# Patient Record
Sex: Female | Born: 1995 | Hispanic: Yes | Marital: Single | Smoking: Former smoker
Health system: Southern US, Community
[De-identification: ages and names within clinical notes are randomized; demographics above are authoritative.]

## PROBLEM LIST (undated history)

## (undated) DIAGNOSIS — Z789 Other specified health status: Secondary | ICD-10-CM

## (undated) DIAGNOSIS — R519 Headache, unspecified: Secondary | ICD-10-CM

## (undated) DIAGNOSIS — R51 Headache: Secondary | ICD-10-CM

## (undated) HISTORY — PX: NO PAST SURGERIES: SHX2092

---

## 2015-02-04 ENCOUNTER — Inpatient Hospital Stay (HOSPITAL_COMMUNITY)
Admission: AD | Admit: 2015-02-04 | Discharge: 2015-02-04 | Disposition: A | Discharge status: Home / Self Care | Payer: Self-pay | Source: Ambulatory Visit | Attending: Obstetrics & Gynecology | Admitting: Obstetrics & Gynecology

## 2015-02-04 ENCOUNTER — Inpatient Hospital Stay (HOSPITAL_COMMUNITY): Payer: Self-pay

## 2015-02-04 ENCOUNTER — Encounter (HOSPITAL_COMMUNITY): Payer: Self-pay | Admitting: Obstetrics and Gynecology

## 2015-02-04 DIAGNOSIS — O3680X0 Pregnancy with inconclusive fetal viability, not applicable or unspecified: Secondary | ICD-10-CM

## 2015-02-04 DIAGNOSIS — Z3A00 Weeks of gestation of pregnancy not specified: Secondary | ICD-10-CM | POA: Insufficient documentation

## 2015-02-04 DIAGNOSIS — O209 Hemorrhage in early pregnancy, unspecified: Secondary | ICD-10-CM

## 2015-02-04 DIAGNOSIS — O200 Threatened abortion: Secondary | ICD-10-CM | POA: Insufficient documentation

## 2015-02-04 LAB — CBC
HCT: 40.8 % (ref 36.0–46.0)
Hemoglobin: 14.1 g/dL (ref 12.0–15.0)
MCH: 32.5 pg (ref 26.0–34.0)
MCHC: 34.6 g/dL (ref 30.0–36.0)
MCV: 94 fL (ref 78.0–100.0)
PLATELETS: 272 10*3/uL (ref 150–400)
RBC: 4.34 MIL/uL (ref 3.87–5.11)
RDW: 12.8 % (ref 11.5–15.5)
WBC: 8.5 10*3/uL (ref 4.0–10.5)

## 2015-02-04 LAB — URINALYSIS, ROUTINE W REFLEX MICROSCOPIC
Bilirubin Urine: NEGATIVE
Glucose, UA: NEGATIVE mg/dL
Ketones, ur: NEGATIVE mg/dL
LEUKOCYTES UA: NEGATIVE
Nitrite: NEGATIVE
Protein, ur: NEGATIVE mg/dL
SPECIFIC GRAVITY, URINE: 1.025 (ref 1.005–1.030)
UROBILINOGEN UA: 0.2 mg/dL (ref 0.0–1.0)
pH: 6 (ref 5.0–8.0)

## 2015-02-04 LAB — URINE MICROSCOPIC-ADD ON

## 2015-02-04 LAB — ABO/RH: ABO/RH(D): O POS

## 2015-02-04 LAB — HCG, QUANTITATIVE, PREGNANCY: HCG, BETA CHAIN, QUANT, S: 72 m[IU]/mL — AB (ref <5)

## 2015-02-04 LAB — POCT PREGNANCY, URINE: Preg Test, Ur: POSITIVE — AB

## 2015-02-04 NOTE — MAU Provider Note (Signed)
History     CSN: [id number]  Arrival date and time: 02/04/15 1132   None     Chief Complaint  Patient presents with  . Vaginal Bleeding   HPI   Ms Mary Herrera is a 19 y.o. female G2P0010 at Unknown.  Presenting to MAU with vaginal bleeding. She started having menstrual like bleeding yesterday; no recent intercourse. In the last 24 hours she has soaked 10 pads.  OB History    Gravida Para Term Preterm AB TAB SAB Ectopic Multiple Living   2 0   1  1   0      Past Medical History  Diagnosis Date  . Heart murmur     Past Surgical History  Procedure Laterality Date  . No past surgeries      History reviewed. No pertinent family history.  History  Substance Use Topics  . Smoking status: Not on file  . Smokeless tobacco: Not on file  . Alcohol Use: Not on file    Allergies: Allergies not on file  No prescriptions prior to admission    Results for orders placed or performed during the hospital encounter of 02/04/15 (from the past 48 hour(s))  Urinalysis, Routine w reflex microscopic (not at [facility])     Status: Abnormal   Collection Time: 02/04/15 11:48 AM  Result Value Ref Range   Color, Urine YELLOW YELLOW   APPearance CLEAR CLEAR   Specific Gravity, Urine 1.025 1.005 - 1.030   pH 6.0 5.0 - 8.0   Glucose, UA NEGATIVE NEGATIVE mg/dL   Hgb urine dipstick LARGE (A) NEGATIVE   Bilirubin Urine NEGATIVE NEGATIVE   Ketones, ur NEGATIVE NEGATIVE mg/dL   Protein, ur NEGATIVE NEGATIVE mg/dL   Urobilinogen, UA 0.2 0.0 - 1.0 mg/dL   Nitrite NEGATIVE NEGATIVE   Leukocytes, UA NEGATIVE NEGATIVE  Urine microscopic-add on     Status: Abnormal   Collection Time: 02/04/15 11:48 AM  Result Value Ref Range   Squamous Epithelial / LPF FEW (A) RARE   WBC, UA 0-2 <3 WBC/hpf   RBC / HPF 0-2 <3 RBC/hpf   Urine-Other MUCOUS PRESENT   Pregnancy, urine POC     Status: Abnormal   Collection Time: 02/04/15 12:50 PM  Result Value Ref Range   Preg Test, Ur POSITIVE (A)  NEGATIVE    Comment:        THE SENSITIVITY OF THIS METHODOLOGY IS >24 mIU/mL   CBC     Status: None   Collection Time: 02/04/15  2:52 PM  Result Value Ref Range   WBC 8.5 4.0 - 10.5 K/uL   RBC 4.34 3.87 - 5.11 MIL/uL   Hemoglobin 14.1 12.0 - 15.0 g/dL   HCT 40.9 81.1 - 91.4 %   MCV 94.0 78.0 - 100.0 fL   MCH 32.5 26.0 - 34.0 pg   MCHC 34.6 30.0 - 36.0 g/dL   RDW 78.2 95.6 - 21.3 %   Platelets 272 150 - 400 K/uL  ABO/Rh     Status: None   Collection Time: 02/04/15  2:52 PM  Result Value Ref Range   ABO/RH(D) O POS   hCG, quantitative, pregnancy     Status: Abnormal   Collection Time: 02/04/15  2:52 PM  Result Value Ref Range   hCG, Beta Chain, Quant, S 72 (H) <5 mIU/mL    Comment:          GEST. AGE      CONC.  (mIU/mL)   <=1 WEEK  5 - 50     2 WEEKS       50 - 500     3 WEEKS       100 - 10,000     4 WEEKS     1,000 - 30,000     5 WEEKS     3,500 - 115,000   6-8 WEEKS     12,000 - 270,000    12 WEEKS     15,000 - 220,000        FEMALE AND NON-PREGNANT FEMALE:     LESS THAN 5 mIU/mL    [place] Ob Comp Less 14 Wks  02/04/2015   CLINICAL DATA:  Heavy vaginal bleeding  EXAM: OBSTETRIC <14 WK US AND TRANSVAGINAL OB US  TECHNIQUE: Both transabdominal and transvaginal ultrasound examinations were performed for complete evaluation of the gestation as well as the maternal uterus, adnexal regions, and pelvic cul-de-sac. Transvaginal technique was performed to assess early pregnancy.  COMPARISON:  None.  FINDINGS: Intrauterine gestational sac: None  Yolk sac:  None  Embryo:  None  Cardiac Activity: Not applicable  Maternal uterus/adnexae:  Subchorionic hemorrhage: Not applicable  Right ovary: Normal  Left ovary: Normal  Other :None  Free fluid:  Trace free fluid within the pelvis.  IMPRESSION: 1. No intrauterine gestational sac, yolk sac, or fetal pole identified. Differential considerations include intrauterine pregnancy too early to be sonographically visualized, missed abortion, or  ectopic pregnancy. Followup ultrasound is recommended in 10-14 days for further evaluation.   Electronically Signed   By: Signa Kellaylor  Stroud M.D.   On: 02/04/2015 15:43   [place] Ob Transvaginal  02/04/2015   CLINICAL DATA:  Heavy vaginal bleeding  EXAM: OBSTETRIC <14 WK US AND TRANSVAGINAL OB US  TECHNIQUE: Both transabdominal and transvaginal ultrasound examinations were performed for complete evaluation of the gestation as well as the maternal uterus, adnexal regions, and pelvic cul-de-sac. Transvaginal technique was performed to assess early pregnancy.  COMPARISON:  None.  FINDINGS: Intrauterine gestational sac: None  Yolk sac:  None  Embryo:  None  Cardiac Activity: Not applicable  Maternal uterus/adnexae:  Subchorionic hemorrhage: Not applicable  Right ovary: Normal  Left ovary: Normal  Other :None  Free fluid:  Trace free fluid within the pelvis.  IMPRESSION: 1. No intrauterine gestational sac, yolk sac, or fetal pole identified. Differential considerations include intrauterine pregnancy too early to be sonographically visualized, missed abortion, or ectopic pregnancy. Followup ultrasound is recommended in 10-14 days for further evaluation.   Electronically Signed   By: Signa Kellaylor  Stroud M.D.   On: 02/04/2015 15:43    Review of Systems  Constitutional: Negative for fever and chills.  Gastrointestinal: Positive for abdominal pain. Negative for nausea and vomiting.  Neurological: Positive for dizziness.   Physical Exam   Blood pressure 132/71, pulse 74, temperature 98 F (36.7 C), temperature source Oral, resp. rate 18, height 5\' 3"  (1.6 m), weight 76.204 kg (168 lb), last menstrual period 12/06/2014.  Physical Exam  Constitutional: She is oriented to person, place, and time. She appears well-developed and well-nourished. No distress.  HENT:  Head: Normocephalic.  Eyes: Pupils are equal, round, and reactive to light.  Neck: Neck supple.  Respiratory: Effort normal.  Genitourinary:  Dilation: Closed,  anterior  Effacement (%): Thick Exam by:: J. Kaegan Hettich NP Small amount of dark red blood on exam glove. Minimal blood noted on peri pad.   Musculoskeletal: Normal range of motion.  Neurological: She is alert and oriented to person, place, and time.  Skin: Skin is warm. She is not diaphoretic.  Psychiatric: Her behavior is normal.    MAU Course  Procedures  None  MDM  O positive blood type  Wet prep  GC  Assessment and Plan    A:  1. Pregnancy of unknown anatomic location   2. Vaginal bleeding in pregnancy, first trimester   3. Threatened miscarriage     P:  Discharge home in stable condition  Bleeding precautions Return to MAU in 48 hours for repeat beta hcg level  Return to MAU if symptoms worsen  Pelvic rest      Duane Lope, NP 02/04/2015 4:29 PM

## 2015-02-04 NOTE — MAU Note (Signed)
Pt presents complaining of vaginal bleeding worse than a period this morning with clots. Also has abdominal pain. 3 pads in last hour.

## 2015-02-04 NOTE — MAU Note (Signed)
Pt reports having started bleeding upon awaking this morning.  Says has changed pad#x in past 5-6 hours.  Bleeding now is dark red/brown.  Slight, lower abd cramping all day, lessening as the day went on.  Denies any dysuria.

## 2015-02-05 LAB — HIV ANTIBODY (ROUTINE TESTING W REFLEX): HIV Screen 4th Generation wRfx: NONREACTIVE

## 2015-02-06 ENCOUNTER — Inpatient Hospital Stay (HOSPITAL_COMMUNITY)
Admission: AD | Admit: 2015-02-06 | Discharge: 2015-02-06 | Disposition: A | Discharge status: Home / Self Care | Payer: Self-pay | Source: Ambulatory Visit | Attending: Obstetrics & Gynecology | Admitting: Obstetrics & Gynecology

## 2015-02-06 DIAGNOSIS — O039 Complete or unspecified spontaneous abortion without complication: Secondary | ICD-10-CM | POA: Insufficient documentation

## 2015-02-06 LAB — HCG, QUANTITATIVE, PREGNANCY: hCG, Beta Chain, Quant, S: 40 m[IU]/mL — ABNORMAL HIGH (ref <5)

## 2015-02-06 NOTE — MAU Note (Signed)
Pt presents for repeat HCG. Denies increase in pain or bleeding.

## 2015-02-06 NOTE — MAU Provider Note (Signed)
Ms. Mary Herrera  is a 19 y.o. G2P0010 at Unknown who presents to MAU today for follow-up quant hCG after 48 hours. The patient was seen on 02/04/15 with moderate vaginal bleeding. She states a small amount of continued bleeding. She denies significant pain.   BP 122/89 mmHg  Pulse 110  Temp(Src) 98 F (36.7 C) (Oral)  Resp 18  LMP 12/06/2014 (Approximate)  CONSTITUTIONAL: Well-developed, well-nourished female in no acute distress.  ENT: External right and left ear normal.  EYES: EOM intact, conjunctivae normal.  MUSCULOSKELETAL: Normal range of motion.  CARDIOVASCULAR: Regular heart rate RESPIRATORY: Normal effort NEUROLOGICAL: Alert and oriented to person, place, and time.  SKIN: Skin is warm and dry. No rash noted. Not diaphoretic. No erythema. No pallor. PSYCH: Normal mood and affect. Normal behavior. Normal judgment and thought content.  Results for Mary Herrera, Mary Herrera (MRN [id number]) as of 02/06/2015 15:21  Ref. Range 02/04/2015 14:52 02/04/2015 15:28 02/06/2015 14:30  HCG, Beta Chain, Quant, S Latest Ref Range: <5 mIU/mL 72 (H)  40 (H)   MDM Discussed with Dr. Erin FullingHarraway-Smith. She agrees that this is likely SAB and patient can follow-up in the [facility] in 1-2 weeks  A: SAB  P: Discharge home Bleeding precautions discussed [facility]. They will call her with an appointment time.  Patient may return to MAU as needed or if her condition were to change or worsen   Marny LowensteinJulie N Wenzel, PA-C 02/06/2015 3:53 PM

## 2015-02-06 NOTE — Discharge Instructions (Signed)
Incomplete Miscarriage A miscarriage is the sudden loss of an unborn baby (fetus) before the 20th week of pregnancy. In an incomplete miscarriage, parts of the fetus or placenta (afterbirth) remain in the body.  Having a miscarriage can be an emotional experience. Talk with your health care provider about any questions you may have about miscarrying, the grieving process, and your future pregnancy plans. CAUSES   Problems with the fetal chromosomes that make it impossible for the baby to develop normally. Problems with the baby's genes or chromosomes are most often the result of errors that occur by chance as the embryo divides and grows. The problems are not inherited from the parents.  Infection of the cervix or uterus.  Hormone problems.  Problems with the cervix, such as having an incompetent cervix. This is when the tissue in the cervix is not strong enough to hold the pregnancy.  Problems with the uterus, such as an abnormally shaped uterus, uterine fibroids, or congenital abnormalities.  Certain medical conditions.  Smoking, drinking alcohol, or taking illegal drugs.  Trauma. SYMPTOMS   Vaginal bleeding or spotting, with or without cramps or pain.  Pain or cramping in the abdomen or lower back.  Passing fluid, tissue, or blood clots from the vagina. DIAGNOSIS  Your health care provider will perform a physical exam. You may also have an ultrasound to confirm the miscarriage. Blood or urine tests may also be ordered. TREATMENT   Usually, a dilation and curettage (D&C) procedure is performed. During a D&C procedure, the cervix is widened (dilated) and any remaining fetal or placental tissue is gently removed from the uterus.  Antibiotic medicines are prescribed if there is an infection. Other medicines may be given to reduce the size of the uterus (contract) if there is a lot of bleeding.  If you have Rh negative blood and your baby was Rh positive, you will need a Rho (D)  immune globulin shot. This shot will protect any future baby from having Rh blood problems in future pregnancies.  You may be confined to bed rest. This means you should stay in bed and only get up to use the bathroom. HOME CARE INSTRUCTIONS   Rest as directed by your health care provider.  Restrict activity as directed by your health care provider. You may be allowed to continue light activity if curettage was not done but you require further treatment.  Keep track of the number of pads you use each day. Keep track of how soaked (saturated) they are. Record this information.  Do not  use tampons.  Do not douche or have sexual intercourse until approved by your health care provider.  Keep all follow-up appointments for reevaluation and continuing management.  Only take over-the-counter or prescription medicines for pain, fever, or discomfort as directed by your health care provider.  Take antibiotic medicine as directed by your health care provider. Make sure you finish it even if you start to feel better. SEEK IMMEDIATE MEDICAL CARE IF:   You experience severe cramps in your stomach, back, or abdomen.  You have an unexplained temperature (make sure to record these temperatures).  You pass large clots or tissue (save these for your health care provider to inspect).  Your bleeding increases.  You become light-headed, weak, or have fainting episodes. MAKE SURE YOU:   Understand these instructions.  Will watch your condition.  Will get help right away if you are not doing well or get worse. Document Released: 07/10/2005 Document Revised: 11/24/2013 Document Reviewed:   02/06/2013 Exit[employer] Patient Information 2015 [employer]. This information is not intended to replace advice given to you by your health care provider. Make sure you discuss any questions you have with your health care provider.  

## 2015-02-22 ENCOUNTER — Encounter: Payer: Self-pay | Admitting: Obstetrics and Gynecology

## 2015-07-24 ENCOUNTER — Emergency Department (HOSPITAL_COMMUNITY): Payer: No Typology Code available for payment source

## 2015-07-24 ENCOUNTER — Encounter (HOSPITAL_COMMUNITY): Payer: Self-pay | Admitting: Oncology

## 2015-07-24 ENCOUNTER — Emergency Department (HOSPITAL_COMMUNITY)
Admission: EM | Admit: 2015-07-24 | Discharge: 2015-07-24 | Disposition: A | Discharge status: Home / Self Care | Payer: No Typology Code available for payment source | Attending: Emergency Medicine | Admitting: Emergency Medicine

## 2015-07-24 DIAGNOSIS — Y92410 Unspecified street and highway as the place of occurrence of the external cause: Secondary | ICD-10-CM | POA: Insufficient documentation

## 2015-07-24 DIAGNOSIS — S0181XA Laceration without foreign body of other part of head, initial encounter: Secondary | ICD-10-CM | POA: Diagnosis not present

## 2015-07-24 DIAGNOSIS — R011 Cardiac murmur, unspecified: Secondary | ICD-10-CM | POA: Insufficient documentation

## 2015-07-24 DIAGNOSIS — Z3202 Encounter for pregnancy test, result negative: Secondary | ICD-10-CM | POA: Insufficient documentation

## 2015-07-24 DIAGNOSIS — R110 Nausea: Secondary | ICD-10-CM | POA: Insufficient documentation

## 2015-07-24 DIAGNOSIS — S01511A Laceration without foreign body of lip, initial encounter: Secondary | ICD-10-CM | POA: Diagnosis not present

## 2015-07-24 DIAGNOSIS — Z880 Allergy status to penicillin: Secondary | ICD-10-CM | POA: Insufficient documentation

## 2015-07-24 DIAGNOSIS — Y9389 Activity, other specified: Secondary | ICD-10-CM | POA: Insufficient documentation

## 2015-07-24 DIAGNOSIS — Y998 Other external cause status: Secondary | ICD-10-CM | POA: Insufficient documentation

## 2015-07-24 DIAGNOSIS — S92302A Fracture of unspecified metatarsal bone(s), left foot, initial encounter for closed fracture: Secondary | ICD-10-CM

## 2015-07-24 DIAGNOSIS — S3991XA Unspecified injury of abdomen, initial encounter: Secondary | ICD-10-CM | POA: Diagnosis not present

## 2015-07-24 DIAGNOSIS — S99912A Unspecified injury of left ankle, initial encounter: Secondary | ICD-10-CM | POA: Diagnosis present

## 2015-07-24 DIAGNOSIS — S92352A Displaced fracture of fifth metatarsal bone, left foot, initial encounter for closed fracture: Secondary | ICD-10-CM | POA: Diagnosis not present

## 2015-07-24 DIAGNOSIS — Z23 Encounter for immunization: Secondary | ICD-10-CM | POA: Insufficient documentation

## 2015-07-24 DIAGNOSIS — Z87891 Personal history of nicotine dependence: Secondary | ICD-10-CM | POA: Insufficient documentation

## 2015-07-24 LAB — I-STAT CHEM 8, ED
BUN: 20 mg/dL (ref 6–20)
CREATININE: 0.7 mg/dL (ref 0.44–1.00)
Calcium, Ion: 1.17 mmol/L (ref 1.12–1.23)
Chloride: 106 mmol/L (ref 101–111)
GLUCOSE: 102 mg/dL — AB (ref 65–99)
HCT: 46 % (ref 36.0–46.0)
HEMOGLOBIN: 15.6 g/dL — AB (ref 12.0–15.0)
Potassium: 3.7 mmol/L (ref 3.5–5.1)
Sodium: 142 mmol/L (ref 135–145)
TCO2: 25 mmol/L (ref 0–100)

## 2015-07-24 LAB — CBC WITH DIFFERENTIAL/PLATELET
BASOS ABS: 0 10*3/uL (ref 0.0–0.1)
BASOS PCT: 0 %
Eosinophils Absolute: 0.1 10*3/uL (ref 0.0–0.7)
Eosinophils Relative: 1 %
HEMATOCRIT: 42.8 % (ref 36.0–46.0)
HEMOGLOBIN: 14.7 g/dL (ref 12.0–15.0)
LYMPHS PCT: 16 %
Lymphs Abs: 1.9 10*3/uL (ref 0.7–4.0)
MCH: 32 pg (ref 26.0–34.0)
MCHC: 34.3 g/dL (ref 30.0–36.0)
MCV: 93 fL (ref 78.0–100.0)
Monocytes Absolute: 0.5 10*3/uL (ref 0.1–1.0)
Monocytes Relative: 4 %
NEUTROS ABS: 9.2 10*3/uL — AB (ref 1.7–7.7)
Neutrophils Relative %: 79 %
Platelets: 226 10*3/uL (ref 150–400)
RBC: 4.6 MIL/uL (ref 3.87–5.11)
RDW: 12.7 % (ref 11.5–15.5)
WBC: 11.6 10*3/uL — ABNORMAL HIGH (ref 4.0–10.5)

## 2015-07-24 LAB — ETHANOL: Alcohol, Ethyl (B): <5 mg/dL (ref <5)

## 2015-07-24 LAB — BASIC METABOLIC PANEL
ANION GAP: 10 (ref 5–15)
BUN: 19 mg/dL (ref 6–20)
CHLORIDE: 108 mmol/L (ref 101–111)
CO2: 24 mmol/L (ref 22–32)
Calcium: 9.5 mg/dL (ref 8.9–10.3)
Creatinine, Ser: 0.69 mg/dL (ref 0.44–1.00)
GFR calc Af Amer: >60 mL/min (ref >60)
GFR calc non Af Amer: >60 mL/min (ref >60)
Glucose, Bld: 103 mg/dL — ABNORMAL HIGH (ref 65–99)
Potassium: 3.9 mmol/L (ref 3.5–5.1)
Sodium: 142 mmol/L (ref 135–145)

## 2015-07-24 LAB — I-STAT BETA HCG BLOOD, ED (MC, WL, AP ONLY)

## 2015-07-24 MED ORDER — LIDOCAINE HCL (PF) 1 % IJ SOLN
30.0000 mL | Freq: Once | INTRAMUSCULAR | Status: AC
  Administered 2015-07-24: 30 mL
  Filled 2015-07-24: qty 30

## 2015-07-24 MED ORDER — BACITRACIN ZINC 500 UNIT/GM EX OINT
TOPICAL_OINTMENT | Freq: Two times a day (BID) | CUTANEOUS | Status: DC
  Administered 2015-07-24: 1 via TOPICAL
  Filled 2015-07-24: qty 1.8

## 2015-07-24 MED ORDER — IOHEXOL 300 MG/ML  SOLN
100.0000 mL | Freq: Once | INTRAMUSCULAR | Status: AC | PRN
  Administered 2015-07-24: 100 mL via INTRAVENOUS

## 2015-07-24 MED ORDER — LIDOCAINE-EPINEPHRINE (PF) 1 %-1:200000 IJ SOLN
INTRAMUSCULAR | Status: AC
  Administered 2015-07-24: 30 mL
  Filled 2015-07-24: qty 30

## 2015-07-24 MED ORDER — ONDANSETRON HCL 4 MG/2ML IJ SOLN
4.0000 mg | INTRAMUSCULAR | Status: AC
  Administered 2015-07-24: 4 mg via INTRAVENOUS
  Filled 2015-07-24: qty 2

## 2015-07-24 MED ORDER — TETANUS-DIPHTH-ACELL PERTUSSIS 5-2.5-18.5 LF-MCG/0.5 IM SUSP
0.5000 mL | Freq: Once | INTRAMUSCULAR | Status: AC
  Administered 2015-07-24: 0.5 mL via INTRAMUSCULAR
  Filled 2015-07-24: qty 0.5

## 2015-07-24 MED ORDER — MORPHINE SULFATE (PF) 4 MG/ML IV SOLN
4.0000 mg | Freq: Once | INTRAVENOUS | Status: AC
  Administered 2015-07-24: 4 mg via INTRAVENOUS
  Filled 2015-07-24: qty 1

## 2015-07-24 MED ORDER — BACITRACIN ZINC 500 UNIT/GM EX OINT: 1.0000 "application " | TOPICAL_OINTMENT | Freq: Two times a day (BID) | CUTANEOUS | Status: DC

## 2015-07-24 NOTE — ED Notes (Signed)
Bed: WA14 Expected date:  Expected time:  Means of arrival:  Comments: ems 
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
## 2015-07-24 NOTE — ED Notes (Signed)
Patient transported to CT 
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
## 2015-07-24 NOTE — Discharge Instructions (Signed)
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 [phone], Ext. [phone] Second and Fourth Thursday of each month, opens at 6:30 AM; Clinic ends at 9 AM.  Patients are  seen on a first-come first-served basis, and a limited number are seen during each clinic.  ° °[facility] ° [address], [place], [place] [phone]   Eligibility Requirements °You must have lived in [place], [place], or [place] counties for at least the last three months. °  You cannot be eligible for state or federal sponsored healthcare insurance, including Veterans Administration, Medicaid, or Medicare. °  You generally cannot be eligible for healthcare insurance through your employer.  °  How to apply: °Eligibility screenings are held every Tuesday and Wednesday afternoon from 1:00 pm until 4:00 pm. You do not need an appointment for the interview!  °[facility] [address], [place], [place] [phone]   °[facility]  336-342-8273   °[facility]  336-703-3100   °[facility]  336-570-6415   ° °Behavioral Health Resources in the Community: °Intensive Outpatient Programs °Organization         Address  Phone  Notes  °[facility] [address], [place], [place] 336-878-6098   °[facility] [address], [place], [place] 336-832-9800   °[facility] [address], [place], [place]  ° 336-882-2125   °[facility] [address],  °, [place] 1-800-853-5163 or 336-641-4981   °Substance Abuse Resources °Organization         Address  Phone  Notes  °[facility]  336-882-2125   °[facility]  336-784-9470   °[facility]  336-285-9073   °[facility]  336-845-3988   °Residential & Outpatient Substance Abuse Program  1-800-659-3381   °Psychological Services °Organization         Address  Phone    Notes  °[facility]  336- 832-9600   °[facility]  336- 378-7881   °[facility] [address], [place] 1-800-853-5163 or 336-641-4981   ° °Mobile Crisis  Teams °Organization         Address  Phone  Notes  °[facility], Mobile Crisis Care Unit  1-877-626-1772   °Assertive °[facility] ° [address] [place], [place] 336-834-9664   °Sharon DeEsch [address] °[place] 336-554-5454   ° °Self-Help/Support Groups °Organization         Address  Phone             Notes  °[facility] - variety of support groups  336- 373-1402 Call for more information  °Narcotics Anonymous (NA), [facility] [address], °[place]  2 meetings at this location  ° °Residential Treatment Programs °Organization         Address  Phone  Notes  °[facility] [address],    °[place]  1-866-801-8205   °[facility] ° [address], [place], [place] 704-293-8524   °[facility] [address], [place] 336-845-3988 Admissions: 8am-3pm M-F  °[facility] [address]    °[place], [place] 336-841-1104   °[facility] [address], [place], [place] 336-379-7146   °[facility] [address]  °[place], [place] 336-285-9073   °[facility] - Intensive Outpatient [address], [place], [place] 336-852-3033   °[facility] [address]  °[place], [place] 1-877-615-2722 or 336-784-9470   °[facility] ([facility] [address] [place], [place] 336-227-7417 Accepts Medicaid  °[facility] [address]  ° [place] 1-800-659-3381 Substance Abuse/Addiction Treatment  ° °[facility] °Organization         Address  Phone  Notes  °[facility]  (888) 581-9988   °Julie Brannon, PhD [address] [place], [place]   (336) 349-5553 or (336) 951-0000   °[facility]   [address] °[place], [place] (336) 349-4454   °[facility] [address], [place], [place] (336) 342-8316  Insurance/Medicaid/sponsorship through [facility]  °[facility] [address]                                    [place], [place] (336) [phone] Therapy/tele-psych/case  °[facility] [address]  ° [place], [place] (336) 349-2233    °Dr. Arfeen  (336) 349-4544   °[facility]  [employer] [facility] 1) [address], [place] °2) [address], [place] °3)  [address], [place] (336) 349-3220 °(336) 342-7768 ° °(336) 342-8140   °[facility] (336) 342-1394 or (336) 342-3537 (After Hours)    ° ° ° °
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
## 2015-07-24 NOTE — ED Notes (Signed)
Plastic surgeon at bedside suturing patient's lip.

## 2015-07-24 NOTE — ED Notes (Signed)
Patient assisted to bathroom in wheelchair 

## 2015-07-25 NOTE — L&D Delivery Note (Signed)
20 y/o F now G2P1 who presented with PPROM and PTL. Was admitted to [facility] and contractions discontinued. They began again this AM. [facility].  Operative Delivery Note At  a viable female was delivered via .  Presentation: OP; Position: Posterior  APGAR: ,pending ; weight   pending Placenta status: intact delivered with gentle traction, sent to pathology Cord:  with the following complications none  Anesthesia:  Local lidocaine for repair Episiotomy:   Lacerations:   1st degree posterior Suture Repair: 3.0 vicryl rapide Est. Blood Loss (mL):  100  Mom to postpartum.  Baby to NICU.  Ernestina Pennaicholas Rola Lennon 03/24/2016, 3:35 PM

## 2016-03-23 ENCOUNTER — Encounter (HOSPITAL_COMMUNITY): Payer: Self-pay | Admitting: *Deleted

## 2016-03-23 ENCOUNTER — Inpatient Hospital Stay (HOSPITAL_COMMUNITY): Payer: Medicaid Other

## 2016-03-23 ENCOUNTER — Inpatient Hospital Stay (HOSPITAL_COMMUNITY)
Admission: AD | Admit: 2016-03-23 | Discharge: 2016-03-26 | DRG: 774 | Disposition: A | Discharge status: Home / Self Care | Payer: Medicaid Other | Source: Ambulatory Visit | Attending: Obstetrics & Gynecology | Admitting: Obstetrics & Gynecology

## 2016-03-23 DIAGNOSIS — F1210 Cannabis abuse, uncomplicated: Secondary | ICD-10-CM | POA: Diagnosis present

## 2016-03-23 DIAGNOSIS — Z880 Allergy status to penicillin: Secondary | ICD-10-CM

## 2016-03-23 DIAGNOSIS — F17210 Nicotine dependence, cigarettes, uncomplicated: Secondary | ICD-10-CM | POA: Diagnosis present

## 2016-03-23 DIAGNOSIS — O99334 Smoking (tobacco) complicating childbirth: Secondary | ICD-10-CM | POA: Diagnosis present

## 2016-03-23 DIAGNOSIS — Z3A30 30 weeks gestation of pregnancy: Secondary | ICD-10-CM | POA: Diagnosis not present

## 2016-03-23 DIAGNOSIS — O42913 Preterm premature rupture of membranes, unspecified as to length of time between rupture and onset of labor, third trimester: Secondary | ICD-10-CM | POA: Diagnosis present

## 2016-03-23 DIAGNOSIS — O42919 Preterm premature rupture of membranes, unspecified as to length of time between rupture and onset of labor, unspecified trimester: Secondary | ICD-10-CM | POA: Diagnosis present

## 2016-03-23 DIAGNOSIS — O99323 Drug use complicating pregnancy, third trimester: Secondary | ICD-10-CM | POA: Diagnosis present

## 2016-03-23 DIAGNOSIS — F1410 Cocaine abuse, uncomplicated: Secondary | ICD-10-CM | POA: Diagnosis present

## 2016-03-23 DIAGNOSIS — Z3493 Encounter for supervision of normal pregnancy, unspecified, third trimester: Secondary | ICD-10-CM

## 2016-03-23 DIAGNOSIS — F1910 Other psychoactive substance abuse, uncomplicated: Secondary | ICD-10-CM | POA: Diagnosis present

## 2016-03-23 DIAGNOSIS — N760 Acute vaginitis: Secondary | ICD-10-CM

## 2016-03-23 DIAGNOSIS — O99324 Drug use complicating childbirth: Secondary | ICD-10-CM | POA: Diagnosis present

## 2016-03-23 DIAGNOSIS — B9689 Other specified bacterial agents as the cause of diseases classified elsewhere: Secondary | ICD-10-CM

## 2016-03-23 HISTORY — DX: Other specified health status: Z78.9

## 2016-03-23 HISTORY — DX: Headache: R51

## 2016-03-23 HISTORY — DX: Headache, unspecified: R51.9

## 2016-03-23 LAB — URINALYSIS, ROUTINE W REFLEX MICROSCOPIC
GLUCOSE, UA: 100 mg/dL — AB
Ketones, ur: >80 mg/dL — AB
Nitrite: NEGATIVE
PH: 6 (ref 5.0–8.0)
Protein, ur: 30 mg/dL — AB
SPECIFIC GRAVITY, URINE: 1.02 (ref 1.005–1.030)

## 2016-03-23 LAB — URINE MICROSCOPIC-ADD ON

## 2016-03-23 LAB — WET PREP, GENITAL
SPERM: NONE SEEN
Trich, Wet Prep: NONE SEEN
YEAST WET PREP: NONE SEEN

## 2016-03-23 LAB — TYPE AND SCREEN
ABO/RH(D): O POS
ANTIBODY SCREEN: NEGATIVE

## 2016-03-23 LAB — HEPATITIS B SURFACE ANTIGEN: HEP B S AG: NEGATIVE

## 2016-03-23 LAB — DIFFERENTIAL
BASOS ABS: 0 10*3/uL (ref 0.0–0.1)
BASOS PCT: 0 %
EOS ABS: 0 10*3/uL (ref 0.0–0.7)
Eosinophils Relative: 0 %
Lymphocytes Relative: 11 %
Lymphs Abs: 1.9 10*3/uL (ref 0.7–4.0)
MONO ABS: 0.7 10*3/uL (ref 0.1–1.0)
MONOS PCT: 4 %
Neutro Abs: 14.8 10*3/uL — ABNORMAL HIGH (ref 1.7–7.7)
Neutrophils Relative %: 85 %

## 2016-03-23 LAB — RAPID URINE DRUG SCREEN, HOSP PERFORMED
Amphetamines: NOT DETECTED
BARBITURATES: NOT DETECTED
Benzodiazepines: NOT DETECTED
Cocaine: POSITIVE — AB
Opiates: NOT DETECTED
Tetrahydrocannabinol: POSITIVE — AB

## 2016-03-23 LAB — CBC
HEMATOCRIT: 37.5 % (ref 36.0–46.0)
Hemoglobin: 13.8 g/dL (ref 12.0–15.0)
MCH: 32.7 pg (ref 26.0–34.0)
MCHC: 36.8 g/dL — AB (ref 30.0–36.0)
MCV: 88.9 fL (ref 78.0–100.0)
Platelets: 227 10*3/uL (ref 150–400)
RBC: 4.22 MIL/uL (ref 3.87–5.11)
RDW: 12.4 % (ref 11.5–15.5)
WBC: 17.4 10*3/uL — ABNORMAL HIGH (ref 4.0–10.5)

## 2016-03-23 MED ORDER — CALCIUM CARBONATE ANTACID 500 MG PO CHEW: 2.0000 | CHEWABLE_TABLET | ORAL | Status: DC | PRN

## 2016-03-23 MED ORDER — CLINDAMYCIN PHOSPHATE 900 MG/50ML IV SOLN
900.0000 mg | Freq: Three times a day (TID) | INTRAVENOUS | Status: DC
  Administered 2016-03-23 – 2016-03-24 (×2): 900 mg via INTRAVENOUS
  Filled 2016-03-23 (×4): qty 50

## 2016-03-23 MED ORDER — PRENATAL MULTIVITAMIN CH
1.0000 | ORAL_TABLET | Freq: Every day | ORAL | Status: DC
  Administered 2016-03-24: 1 via ORAL
  Filled 2016-03-23: qty 1

## 2016-03-23 MED ORDER — MAGNESIUM SULFATE BOLUS VIA INFUSION
4.0000 g | Freq: Once | INTRAVENOUS | Status: AC
  Administered 2016-03-23: 4 g via INTRAVENOUS
  Filled 2016-03-23: qty 500

## 2016-03-23 MED ORDER — DOCUSATE SODIUM 100 MG PO CAPS
100.0000 mg | ORAL_CAPSULE | Freq: Every day | ORAL | Status: DC
  Administered 2016-03-24: 100 mg via ORAL
  Filled 2016-03-23: qty 1

## 2016-03-23 MED ORDER — AZITHROMYCIN 500 MG PO TABS: 500.0000 mg | ORAL_TABLET | Freq: Every day | ORAL | Status: DC

## 2016-03-23 MED ORDER — ZOLPIDEM TARTRATE 5 MG PO TABS: 5.0000 mg | ORAL_TABLET | Freq: Every evening | ORAL | Status: DC | PRN

## 2016-03-23 MED ORDER — DEXTROSE 5 % IV SOLN
500.0000 mg | INTRAVENOUS | Status: DC
  Administered 2016-03-23: 500 mg via INTRAVENOUS
  Filled 2016-03-23 (×2): qty 500

## 2016-03-23 MED ORDER — ACETAMINOPHEN 325 MG PO TABS: 650.0000 mg | ORAL_TABLET | ORAL | Status: DC | PRN

## 2016-03-23 MED ORDER — MAGNESIUM SULFATE 50 % IJ SOLN
2.0000 g/h | INTRAVENOUS | Status: DC
  Administered 2016-03-23: 2 g/h via INTRAVENOUS
  Filled 2016-03-23: qty 80

## 2016-03-23 MED ORDER — SODIUM CHLORIDE 0.9 % IV SOLN: INTRAVENOUS | Status: DC

## 2016-03-23 MED ORDER — BETAMETHASONE SOD PHOS & ACET 6 (3-3) MG/ML IJ SUSP
12.0000 mg | INTRAMUSCULAR | Status: DC
  Administered 2016-03-23: 12 mg via INTRAMUSCULAR
  Filled 2016-03-23 (×2): qty 2

## 2016-03-23 MED ORDER — METRONIDAZOLE 500 MG PO TABS
500.0000 mg | ORAL_TABLET | Freq: Two times a day (BID) | ORAL | Status: DC
  Administered 2016-03-23 – 2016-03-24 (×2): 500 mg via ORAL
  Filled 2016-03-23 (×4): qty 1

## 2016-03-23 NOTE — MAU Provider Note (Signed)
Chief Complaint:  Back Pain; Abdominal Pain; and Vaginal Discharge   First Provider Initiated Contact with Patient 03/23/16 1659     HPI: Mary Herrera is a 20 y.o. G2P0010 at 45w2dwho presents to maternity admissions reporting bloody mucous and abdominal sharp pain since yesterday.  No prenatal care "I don't know why".  States pregnancy undesired, plans adoption, but has made no arrangements. Tearful, quiet.  Female at bedside, unsure relation.. She reports good fetal movement, denies LOF, vaginal bleeding, vaginal itching/burning, urinary symptoms, h/a, dizziness, n/v, diarrhea, constipation or fever/chills.  She denies headache, visual changes or RUQ abdominal pain.  Abdominal Pain  This is a new problem. The current episode started yesterday. The onset quality is gradual. The problem occurs intermittently. The problem has been unchanged. The pain is located in the LLQ, RLQ and epigastric region. The pain is moderate. The quality of the pain is cramping and sharp. The abdominal pain does not radiate. Pertinent negatives include no constipation, diarrhea, dysuria, fever, headaches, myalgias, nausea or vomiting. The pain is relieved by nothing. She has tried nothing for the symptoms.   RN Note: 20 yr old; G2P!; no prenatal care; admitted to using cocaine and pot; hx of SAB; c/o reddish, mucousy, vaginal discharge; c/o low abdominal cramping that she rates @ 8 and c/o lower achy lower back; my LNMP, pt is [redacted]w[redacted]d- but pt is not sure of dates;  Past Medical History: Past Medical History:  Diagnosis Date  . Headache     Past obstetric history: OB History  Gravida Para Term Preterm AB Living  2 0     1 0  SAB TAB Ectopic Multiple Live Births  1            # Outcome Date GA Lbr Len/2nd Weight Sex Delivery Anes PTL Lv  2 Current           1 SAB               Past Surgical History: Past Surgical History:  Procedure Laterality Date  . NO PAST SURGERIES      Family History: Family  History  Problem Relation Age of Onset  . Diabetes Paternal Grandmother   . Hypertension Paternal Grandmother   . Alcohol abuse Neg Hx   . Arthritis Neg Hx   . Asthma Neg Hx   . Birth defects Neg Hx   . Cancer Neg Hx   . COPD Neg Hx   . Depression Neg Hx   . Drug abuse Neg Hx   . Early death Neg Hx   . Hearing loss Neg Hx   . Heart disease Neg Hx   . Hyperlipidemia Neg Hx   . Kidney disease Neg Hx   . Learning disabilities Neg Hx   . Mental illness Neg Hx   . Mental retardation Neg Hx   . Miscarriages / Stillbirths Neg Hx   . Stroke Neg Hx   . Vision loss Neg Hx   . Varicose Veins Neg Hx     Social History: Social History  Substance Use Topics  . Smoking status: Current Every Day Smoker    Packs/day: 0.25    Types: Cigarettes    Last attempt to quit: 01/21/2015  . Smokeless tobacco: Current User  . Alcohol use Yes     Comment: prior to pregnancy  + cocaine + MJ  Allergies:  Allergies  Allergen Reactions  . Penicillins Rash    Has patient had a PCN reaction causing immediate rash,  facial/tongue/throat swelling, SOB or lightheadedness with hypotension:Yes Has patient had a PCN reaction causing severe rash involving mucus membranes or skin necrosis:Yes Has patient had a PCN reaction that required hospitalization:Yes Has patient had a PCN reaction occurring within the last 10 years:Yes If all of the above answers are "NO", then may proceed with Cephalosporin use.     Meds:  Prescriptions Prior to Admission  Medication Sig Dispense Refill Last Dose  . bacitracin ointment Apply 1 application topically 2 (two) times daily. 120 g 0   . ibuprofen (ADVIL,MOTRIN) 200 MG tablet Take 400 mg by mouth every 6 (six) hours as needed (for pain.).   unknown    I have reviewed patient's Past Medical Hx, Surgical Hx, Family Hx, Social Hx, medications and allergies.   ROS:  Review of Systems  Constitutional: Negative for fever.  Gastrointestinal: Positive for abdominal pain.  Negative for constipation, diarrhea, nausea and vomiting.  Genitourinary: Negative for dysuria.  Musculoskeletal: Negative for myalgias.  Neurological: Negative for headaches.   Other systems negative  Physical Exam  Patient Vitals for the past 24 hrs:  BP Temp Temp src Pulse Resp  03/23/16 1542 128/75 98 F (36.7 C) Oral 62 18   Constitutional: Well-developed, well-nourished female in no acute distress.  Cardiovascular: normal rate and rhythm Respiratory: normal effort, clear to auscultation bilaterally GI: Abd soft, non-tender, gravid appropriate for gestational age.   No rebound or guarding. MS: Extremities nontender, no edema, normal ROM Neurologic: Alert and oriented x 4.  GU: Neg CVAT.  PELVIC EXAM: Cervix open, completely effaced, about 5cm.                           + pooling orange/brown fluid                           + ferning                            No other vaginal lesions.  Fundal height 34 cm   FHT:  Baseline 140 , moderate variability, accelerations present, no decelerations Contractions:  Irregular    Labs:    Results for orders placed or performed during the hospital encounter of 03/23/16 (from the past 24 hour(s))  Urinalysis, Routine w reflex microscopic (not [facility])     Status: Abnormal   Collection Time: 03/23/16  3:47 PM  Result Value Ref Range   Color, Urine AMBER (A) YELLOW   APPearance CLEAR CLEAR   Specific Gravity, Urine 1.020 1.005 - 1.030   pH 6.0 5.0 - 8.0   Glucose, UA 100 (A) NEGATIVE mg/dL   Hgb urine dipstick LARGE (A) NEGATIVE   Bilirubin Urine MODERATE (A) NEGATIVE   Ketones, ur >80 (A) NEGATIVE mg/dL   Protein, ur 30 (A) NEGATIVE mg/dL   Nitrite NEGATIVE NEGATIVE   Leukocytes, UA LARGE (A) NEGATIVE  Urine microscopic-add on     Status: Abnormal   Collection Time: 03/23/16  3:47 PM  Result Value Ref Range   Squamous Epithelial / LPF 6-30 (A) NONE SEEN   WBC, UA 6-30 0 - 5 WBC/hpf   RBC / HPF 0-5 0 - 5 RBC/hpf   Bacteria,  UA FEW (A) NONE SEEN   Urine-Other MUCOUS PRESENT   Urine rapid drug screen (hosp performed)     Status: Abnormal   Collection Time: 03/23/16  3:47 PM  Result Value Ref Range   Opiates NONE DETECTED NONE DETECTED   Cocaine POSITIVE (A) NONE DETECTED   Benzodiazepines NONE DETECTED NONE DETECTED   Amphetamines NONE DETECTED NONE DETECTED   Tetrahydrocannabinol POSITIVE (A) NONE DETECTED   Barbiturates NONE DETECTED NONE DETECTED  Wet prep, genital     Status: Abnormal   Collection Time: 03/23/16  5:00 PM  Result Value Ref Range   Yeast Wet Prep HPF POC NONE SEEN NONE SEEN   Trich, Wet Prep NONE SEEN NONE SEEN   Clue Cells Wet Prep HPF POC PRESENT (A) NONE SEEN   WBC, Wet Prep HPF POC MANY (A) NONE SEEN   Sperm NONE SEEN   CBC     Status: Abnormal   Collection Time: 03/23/16  5:31 PM  Result Value Ref Range   WBC 17.4 (H) 4.0 - 10.5 K/uL   RBC 4.22 3.87 - 5.11 MIL/uL   Hemoglobin 13.8 12.0 - 15.0 g/dL   HCT 16.1 09.6 - 04.5 %   MCV 88.9 78.0 - 100.0 fL   MCH 32.7 26.0 - 34.0 pg   MCHC 36.8 (H) 30.0 - 36.0 g/dL   RDW 40.9 81.1 - 91.4 %   Platelets 227 150 - 400 K/uL  Differential     Status: Abnormal   Collection Time: 03/23/16  5:31 PM  Result Value Ref Range   Neutrophils Relative % 85 %   Neutro Abs 14.8 (H) 1.7 - 7.7 K/uL   Lymphocytes Relative 11 %   Lymphs Abs 1.9 0.7 - 4.0 K/uL   Monocytes Relative 4 %   Monocytes Absolute 0.7 0.1 - 1.0 K/uL   Eosinophils Relative 0 %   Eosinophils Absolute 0.0 0.0 - 0.7 K/uL   Basophils Relative 0 %   Basophils Absolute 0.0 0.0 - 0.1 K/uL  Type and screen     Status: None (Preliminary result)   Collection Time: 03/23/16  5:31 PM  Result Value Ref Range   ABO/RH(D) O POS    Antibody Screen PENDING    Sample Expiration 03/26/2016      Imaging:  No results found.  MAU Course/MDM: I have ordered labs and reviewed results.  NST reviewed Consult Dr Alysia Penna with presentation, exam findings and test results.  Will admit for  antibiotics, neuroprophylaxis, and observation for now. May need to augment later due to PPROM.    Assessment: SIngle IUP at [redacted]w[redacted]d  Fundal height 34cm Preterm premature rupture of membranes Preterm labor Bacterial vaginosis UDS positive for cocaine and MJ  Plan: Admit to Antenatal unit Routine orders PPROM antibiotics Betamethasone Flagyl for BV Social work consult Magnesium sulfate for neuroprophylaxis and to allow Betamethasone  Wynelle Bourgeois CNM, MSN Certified Nurse-Midwife 03/23/2016 4:59 PM

## 2016-03-23 NOTE — H&P (Signed)
Obstetrics  Expand All Collapse All   [] Hide copied text Chief Complaint:  Back Pain; Abdominal Pain; and Vaginal Discharge   First Provider Initiated Contact with Patient 03/23/16 1659     HPI: Mary OberBrenda Herrera is a 20 y.o. G2P0010 at 2254w2dwho presents to maternity admissions reporting bloody mucous and abdominal sharp pain since yesterday.  No prenatal care "I don't know why".  States pregnancy undesired, plans adoption, but has made no arrangements. Tearful, quiet.  Female at bedside, unsure relation.. She reports good fetal movement, denies LOF, vaginal bleeding, vaginal itching/burning, urinary symptoms, h/a, dizziness, n/v, diarrhea, constipation or fever/chills.  She denies headache, visual changes or RUQ abdominal pain.  Abdominal Pain  This is a new problem. The current episode started yesterday. The onset quality is gradual. The problem occurs intermittently. The problem has been unchanged. The pain is located in the LLQ, RLQ and epigastric region. The pain is moderate. The quality of the pain is cramping and sharp. The abdominal pain does not radiate. Pertinent negatives include no constipation, diarrhea, dysuria, fever, headaches, myalgias, nausea or vomiting. The pain is relieved by nothing. She has tried nothing for the symptoms.   RN Note: 20 yr old; G2P!; no prenatal care; admitted to using cocaine and pot; hx of SAB; c/o reddish, mucousy, vaginal discharge; c/o low abdominal cramping that she rates @ 8 and c/o lower achy lower back; my LNMP, pt is 5354w2d- but pt is not sure of dates;  Past Medical History:     Past Medical History:  Diagnosis Date  . Headache     Past obstetric history:                 OB History  Gravida Para Term Preterm AB Living  2 0     1 0  SAB TAB Ectopic Multiple Live Births  1            # Outcome Date GA Lbr Len/2nd Weight Sex Delivery Anes PTL Lv  2 Current           1 SAB               Past Surgical  History:      Past Surgical History:  Procedure Laterality Date  . NO PAST SURGERIES      Family History:      Family History  Problem Relation Age of Onset  . Diabetes Paternal Grandmother   . Hypertension Paternal Grandmother   . Alcohol abuse Neg Hx   . Arthritis Neg Hx   . Asthma Neg Hx   . Birth defects Neg Hx   . Cancer Neg Hx   . COPD Neg Hx   . Depression Neg Hx   . Drug abuse Neg Hx   . Early death Neg Hx   . Hearing loss Neg Hx   . Heart disease Neg Hx   . Hyperlipidemia Neg Hx   . Kidney disease Neg Hx   . Learning disabilities Neg Hx   . Mental illness Neg Hx   . Mental retardation Neg Hx   . Miscarriages / Stillbirths Neg Hx   . Stroke Neg Hx   . Vision loss Neg Hx   . Varicose Veins Neg Hx     Social History:        Social History  Substance Use Topics  . Smoking status: Current Every Day Smoker    Packs/day: 0.25    Types: Cigarettes    Last attempt to quit:  01/21/2015  . Smokeless tobacco: Current User  . Alcohol use Yes      Comment: prior to pregnancy  + cocaine + MJ  Allergies:       Allergies  Allergen Reactions  . Penicillins Rash    Has patient had a PCN reaction causing immediate rash, facial/tongue/throat swelling, SOB or lightheadedness with hypotension:Yes Has patient had a PCN reaction causing severe rash involving mucus membranes or skin necrosis:Yes Has patient had a PCN reaction that required hospitalization:Yes Has patient had a PCN reaction occurring within the last 10 years:Yes If all of the above answers are "NO", then may proceed with Cephalosporin use.    Meds:         Prescriptions Prior to Admission  Medication Sig Dispense Refill Last Dose  . bacitracin ointment Apply 1 application topically 2 (two) times daily. 120 g 0   . ibuprofen (ADVIL,MOTRIN) 200 MG tablet Take 400 mg by mouth every 6 (six) hours as needed (for pain.).   unknown    I have reviewed patient's  Past Medical Hx, Surgical Hx, Family Hx, Social Hx, medications and allergies.   ROS:  Review of Systems  Constitutional: Negative for fever.  Gastrointestinal: Positive for abdominal pain. Negative for constipation, diarrhea, nausea and vomiting.  Genitourinary: Negative for dysuria.  Musculoskeletal: Negative for myalgias.  Neurological: Negative for headaches.   Other systems negative  Physical Exam  Patient Vitals for the past 24 hrs:  BP Temp Temp src Pulse Resp  03/23/16 1542 128/75 98 F (36.7 C) Oral 62 18   Constitutional: Well-developed, well-nourished female in no acute distress.  Cardiovascular: normal rate and rhythm Respiratory: normal effort, clear to auscultation bilaterally GI: Abd soft, non-tender, gravid appropriate for gestational age.   No rebound or guarding. MS: Extremities nontender, no edema, normal ROM Neurologic: Alert and oriented x 4.  GU: Neg CVAT.  PELVIC EXAM: Cervix open, completely effaced, about 5cm.                           + pooling orange/brown fluid                           + ferning                            No other vaginal lesions.  Fundal height 34 cm   FHT:  Baseline 140 , moderate variability, accelerations present, no decelerations Contractions:  Irregular    Labs:    LabResultsLast24Hours       Results for orders placed or performed during the hospital encounter of 03/23/16 (from the past 24 hour(s))  Urinalysis, Routine w reflex microscopic (not at [facility])     Status: Abnormal   Collection Time: 03/23/16  3:47 PM  Result Value Ref Range   Color, Urine AMBER (A) YELLOW   APPearance CLEAR CLEAR   Specific Gravity, Urine 1.020 1.005 - 1.030   pH 6.0 5.0 - 8.0   Glucose, UA 100 (A) NEGATIVE mg/dL   Hgb urine dipstick LARGE (A) NEGATIVE   Bilirubin Urine MODERATE (A) NEGATIVE   Ketones, ur >80 (A) NEGATIVE mg/dL   Protein, ur 30 (A) NEGATIVE mg/dL   Nitrite NEGATIVE NEGATIVE   Leukocytes, UA  LARGE (A) NEGATIVE  Urine microscopic-add on     Status: Abnormal   Collection Time: 03/23/16  3:47 PM  Result  Value Ref Range   Squamous Epithelial / LPF 6-30 (A) NONE SEEN   WBC, UA 6-30 0 - 5 WBC/hpf   RBC / HPF 0-5 0 - 5 RBC/hpf   Bacteria, UA FEW (A) NONE SEEN   Urine-Other MUCOUS PRESENT   Urine rapid drug screen (hosp performed)     Status: Abnormal   Collection Time: 03/23/16  3:47 PM  Result Value Ref Range   Opiates NONE DETECTED NONE DETECTED   Cocaine POSITIVE (A) NONE DETECTED   Benzodiazepines NONE DETECTED NONE DETECTED   Amphetamines NONE DETECTED NONE DETECTED   Tetrahydrocannabinol POSITIVE (A) NONE DETECTED   Barbiturates NONE DETECTED NONE DETECTED  Wet prep, genital     Status: Abnormal   Collection Time: 03/23/16  5:00 PM  Result Value Ref Range   Yeast Wet Prep HPF POC NONE SEEN NONE SEEN   Trich, Wet Prep NONE SEEN NONE SEEN   Clue Cells Wet Prep HPF POC PRESENT (A) NONE SEEN   WBC, Wet Prep HPF POC MANY (A) NONE SEEN   Sperm NONE SEEN   CBC     Status: Abnormal   Collection Time: 03/23/16  5:31 PM  Result Value Ref Range   WBC 17.4 (H) 4.0 - 10.5 K/uL   RBC 4.22 3.87 - 5.11 MIL/uL   Hemoglobin 13.8 12.0 - 15.0 g/dL   HCT 16.1 09.6 - 04.5 %   MCV 88.9 78.0 - 100.0 fL   MCH 32.7 26.0 - 34.0 pg   MCHC 36.8 (H) 30.0 - 36.0 g/dL   RDW 40.9 81.1 - 91.4 %   Platelets 227 150 - 400 K/uL  Differential     Status: Abnormal   Collection Time: 03/23/16  5:31 PM  Result Value Ref Range   Neutrophils Relative % 85 %   Neutro Abs 14.8 (H) 1.7 - 7.7 K/uL   Lymphocytes Relative 11 %   Lymphs Abs 1.9 0.7 - 4.0 K/uL   Monocytes Relative 4 %   Monocytes Absolute 0.7 0.1 - 1.0 K/uL   Eosinophils Relative 0 %   Eosinophils Absolute 0.0 0.0 - 0.7 K/uL   Basophils Relative 0 %   Basophils Absolute 0.0 0.0 - 0.1 K/uL  Type and screen     Status: None (Preliminary result)   Collection Time: 03/23/16  5:31 PM  Result Value  Ref Range   ABO/RH(D) O POS    Antibody Screen PENDING    Sample Expiration 03/26/2016        Imaging:  ImagingResults  No results found.    MAU Course/MDM: I have ordered labs and reviewed results.  NST reviewed Consult Dr Alysia Penna with presentation, exam findings and test results.  Will admit for antibiotics, neuroprophylaxis, and observation for now. May need to augment later due to PPROM.    Assessment: SIngle IUP at [redacted]w[redacted]d  Fundal height 34cm Preterm premature rupture of membranes Preterm labor Bacterial vaginosis UDS positive for cocaine and MJ  Plan: Admit to Antenatal unit Routine orders PPROM antibiotics Betamethasone Flagyl for BV Social work consult Magnesium sulfate for neuroprophylaxis and to allow Betamethasone  Wynelle Bourgeois CNM, MSN Certified Nurse-Midwife 03/23/2016 4:59 PM

## 2016-03-23 NOTE — MAU Note (Addendum)
Pt C/O sharp lower back & lower abd pain since yesterday, also started bleeding yesterday.  Bleeding is orange mucus consistency.  [facility]

## 2016-03-23 NOTE — MAU Note (Addendum)
20 yr old; G2P!; no prenatal care; admitted to using cocaine and pot; hx of SAB; c/o reddish, mucousy, vaginal discharge; c/o low abdominal cramping that she rates @ 8 and c/o lower achy lower back;not sure of LNMP, pt is 8545w2d- but pt is not sure of dates;

## 2016-03-24 ENCOUNTER — Encounter (HOSPITAL_COMMUNITY): Payer: Self-pay | Admitting: Neonatology

## 2016-03-24 DIAGNOSIS — O99334 Smoking (tobacco) complicating childbirth: Secondary | ICD-10-CM

## 2016-03-24 DIAGNOSIS — O99324 Drug use complicating childbirth: Secondary | ICD-10-CM

## 2016-03-24 DIAGNOSIS — O42913 Preterm premature rupture of membranes, unspecified as to length of time between rupture and onset of labor, third trimester: Secondary | ICD-10-CM

## 2016-03-24 DIAGNOSIS — Z3A30 30 weeks gestation of pregnancy: Secondary | ICD-10-CM

## 2016-03-24 LAB — CBC WITH DIFFERENTIAL/PLATELET
BASOS ABS: 0 10*3/uL (ref 0.0–0.1)
BASOS PCT: 0 %
Eosinophils Absolute: 0 10*3/uL (ref 0.0–0.7)
Eosinophils Relative: 0 %
HEMATOCRIT: 37 % (ref 36.0–46.0)
HEMOGLOBIN: 13.3 g/dL (ref 12.0–15.0)
Lymphocytes Relative: 7 %
Lymphs Abs: 1.3 10*3/uL (ref 0.7–4.0)
MCH: 32.8 pg (ref 26.0–34.0)
MCHC: 35.9 g/dL (ref 30.0–36.0)
MCV: 91.1 fL (ref 78.0–100.0)
MONO ABS: 0.7 10*3/uL (ref 0.1–1.0)
Monocytes Relative: 4 %
NEUTROS ABS: 17.5 10*3/uL — AB (ref 1.7–7.7)
NEUTROS PCT: 90 %
Platelets: 227 10*3/uL (ref 150–400)
RBC: 4.06 MIL/uL (ref 3.87–5.11)
RDW: 12.2 % (ref 11.5–15.5)
WBC: 19.5 10*3/uL — ABNORMAL HIGH (ref 4.0–10.5)

## 2016-03-24 LAB — GC/CHLAMYDIA PROBE AMP (~~LOC~~) NOT AT ARMC
CHLAMYDIA, DNA PROBE: NEGATIVE
NEISSERIA GONORRHEA: NEGATIVE

## 2016-03-24 LAB — HIV ANTIBODY (ROUTINE TESTING W REFLEX): HIV Screen 4th Generation wRfx: NONREACTIVE

## 2016-03-24 LAB — RPR: RPR: NONREACTIVE

## 2016-03-24 LAB — RUBELLA SCREEN: Rubella: 3.63 index (ref >0.99)

## 2016-03-24 MED ORDER — OXYTOCIN BOLUS FROM INFUSION
500.0000 mL | Freq: Once | INTRAVENOUS | Status: AC
  Administered 2016-03-24: 500 mL via INTRAVENOUS

## 2016-03-24 MED ORDER — DIBUCAINE 1 % RE OINT: 1.0000 "application " | TOPICAL_OINTMENT | RECTAL | Status: DC | PRN

## 2016-03-24 MED ORDER — OXYTOCIN 10 UNIT/ML IJ SOLN
INTRAMUSCULAR | Status: AC
  Filled 2016-03-24: qty 1

## 2016-03-24 MED ORDER — ACETAMINOPHEN 325 MG PO TABS: 650.0000 mg | ORAL_TABLET | ORAL | Status: DC | PRN

## 2016-03-24 MED ORDER — OXYCODONE-ACETAMINOPHEN 5-325 MG PO TABS: 1.0000 | ORAL_TABLET | ORAL | Status: DC | PRN

## 2016-03-24 MED ORDER — EPHEDRINE 5 MG/ML INJ
10.0000 mg | INTRAVENOUS | Status: DC | PRN
  Filled 2016-03-24: qty 4

## 2016-03-24 MED ORDER — SIMETHICONE 80 MG PO CHEW: 80.0000 mg | CHEWABLE_TABLET | ORAL | Status: DC | PRN

## 2016-03-24 MED ORDER — LIDOCAINE HCL (PF) 1 % IJ SOLN
30.0000 mL | INTRAMUSCULAR | Status: AC | PRN
  Administered 2016-03-24: 30 mL via SUBCUTANEOUS
  Filled 2016-03-24: qty 30

## 2016-03-24 MED ORDER — LACTATED RINGERS IV SOLN: 500.0000 mL | INTRAVENOUS | Status: DC | PRN

## 2016-03-24 MED ORDER — BENZOCAINE-MENTHOL 20-0.5 % EX AERO
1.0000 "application " | INHALATION_SPRAY | CUTANEOUS | Status: DC | PRN
  Administered 2016-03-24: 1 via TOPICAL
  Filled 2016-03-24: qty 56

## 2016-03-24 MED ORDER — ZOLPIDEM TARTRATE 5 MG PO TABS: 5.0000 mg | ORAL_TABLET | Freq: Every evening | ORAL | Status: DC | PRN

## 2016-03-24 MED ORDER — OXYTOCIN 40 UNITS IN LACTATED RINGERS INFUSION - SIMPLE MED: 2.5000 [IU]/h | INTRAVENOUS | Status: DC

## 2016-03-24 MED ORDER — ONDANSETRON HCL 4 MG/2ML IJ SOLN: 4.0000 mg | Freq: Four times a day (QID) | INTRAMUSCULAR | Status: DC | PRN

## 2016-03-24 MED ORDER — ONDANSETRON HCL 4 MG/2ML IJ SOLN: 4.0000 mg | INTRAMUSCULAR | Status: DC | PRN

## 2016-03-24 MED ORDER — PHENYLEPHRINE 40 MCG/ML (10ML) SYRINGE FOR IV PUSH (FOR BLOOD PRESSURE SUPPORT)
80.0000 ug | PREFILLED_SYRINGE | INTRAVENOUS | Status: DC | PRN
  Filled 2016-03-24: qty 10
  Filled 2016-03-24: qty 5

## 2016-03-24 MED ORDER — FENTANYL 2.5 MCG/ML BUPIVACAINE 1/10 % EPIDURAL INFUSION (WH - ANES)
14.0000 mL/h | INTRAMUSCULAR | Status: DC | PRN
  Filled 2016-03-24: qty 125

## 2016-03-24 MED ORDER — OXYTOCIN 40 UNITS IN LACTATED RINGERS INFUSION - SIMPLE MED
INTRAVENOUS | Status: AC
  Filled 2016-03-24: qty 1000

## 2016-03-24 MED ORDER — OXYCODONE-ACETAMINOPHEN 5-325 MG PO TABS
2.0000 | ORAL_TABLET | ORAL | Status: DC | PRN
  Administered 2016-03-24: 2 via ORAL
  Filled 2016-03-24: qty 2

## 2016-03-24 MED ORDER — ONDANSETRON HCL 4 MG PO TABS: 4.0000 mg | ORAL_TABLET | ORAL | Status: DC | PRN

## 2016-03-24 MED ORDER — DIPHENHYDRAMINE HCL 25 MG PO CAPS: 25.0000 mg | ORAL_CAPSULE | Freq: Four times a day (QID) | ORAL | Status: DC | PRN

## 2016-03-24 MED ORDER — SENNOSIDES-DOCUSATE SODIUM 8.6-50 MG PO TABS
2.0000 | ORAL_TABLET | ORAL | Status: DC
  Administered 2016-03-24 – 2016-03-26 (×2): 2 via ORAL
  Filled 2016-03-24 (×2): qty 2

## 2016-03-24 MED ORDER — SOD CITRATE-CITRIC ACID 500-334 MG/5ML PO SOLN: 30.0000 mL | ORAL | Status: DC | PRN

## 2016-03-24 MED ORDER — TETANUS-DIPHTH-ACELL PERTUSSIS 5-2.5-18.5 LF-MCG/0.5 IM SUSP: 0.5000 mL | Freq: Once | INTRAMUSCULAR | Status: DC

## 2016-03-24 MED ORDER — PHENYLEPHRINE 40 MCG/ML (10ML) SYRINGE FOR IV PUSH (FOR BLOOD PRESSURE SUPPORT)
80.0000 ug | PREFILLED_SYRINGE | INTRAVENOUS | Status: DC | PRN
  Filled 2016-03-24: qty 5

## 2016-03-24 MED ORDER — LACTATED RINGERS IV SOLN: 500.0000 mL | Freq: Once | INTRAVENOUS | Status: DC

## 2016-03-24 MED ORDER — PRENATAL MULTIVITAMIN CH
1.0000 | ORAL_TABLET | Freq: Every day | ORAL | Status: DC
  Administered 2016-03-25: 1 via ORAL
  Filled 2016-03-24: qty 1

## 2016-03-24 MED ORDER — WITCH HAZEL-GLYCERIN EX PADS: 1.0000 "application " | MEDICATED_PAD | CUTANEOUS | Status: DC | PRN

## 2016-03-24 MED ORDER — DIPHENHYDRAMINE HCL 50 MG/ML IJ SOLN: 12.5000 mg | INTRAMUSCULAR | Status: DC | PRN

## 2016-03-24 MED ORDER — LIDOCAINE HCL (PF) 1 % IJ SOLN
INTRAMUSCULAR | Status: AC
  Filled 2016-03-24: qty 30

## 2016-03-24 MED ORDER — COCONUT OIL OIL: 1.0000 "application " | TOPICAL_OIL | Status: DC | PRN

## 2016-03-24 MED ORDER — LACTATED RINGERS IV SOLN: INTRAVENOUS | Status: DC

## 2016-03-24 MED ORDER — FLEET ENEMA 7-19 GM/118ML RE ENEM: 1.0000 | ENEMA | RECTAL | Status: DC | PRN

## 2016-03-24 MED ORDER — IBUPROFEN 600 MG PO TABS
600.0000 mg | ORAL_TABLET | Freq: Four times a day (QID) | ORAL | Status: DC
  Administered 2016-03-24 – 2016-03-26 (×7): 600 mg via ORAL
  Filled 2016-03-24 (×7): qty 1

## 2016-03-24 NOTE — Clinical SW OB High Risk (Signed)
Clinical Social Work Antenatal   Clinical Social Worker:  Miyani Cronic D BOYD-GILYARD, LCSW Date/Time:  03/24/2016, 10:49 AM Gestational Age on Admission:  20 y.o. Admitting Diagnosis:  PROM   Expected Delivery Date:   (unknown; patient is either 30 or 34 weeks)  Family/Home Environment  Home Address:  [address] [place] [address]  Household Member/Support Name:  Patient's parents, and younger siblings Relationship:  Other (patient) Other Support:  Patient's Uncle   Psychosocial Data  Information Source:  Patient Interview Resources:  Patient was offered substance abuse counseling and resources for parenting education; at this time patient declined information.   Employment:  unemployeed   Medicaid (County):  N/A School:  Graduated from high school   Current Grade:  N/A  Homebound Arranged: No  Other Resources:   (no insurance at this time)  Cultural/Environment Issues Impacting Care:  None Reported   Strengths/Weaknesses/Factors to Consider  Concerns Related to Hospitalization:  Patient was aware of pregnancy, however patient did not receive prenatal care. Patient had a positve UDS for THC and cocaine on 03/23/2016. Patient reports not having a relationship with FOB at this time.    Previous Pregnancies/Feelings Towards Pregnancy?  Concerns related to being/becoming a mother?:  Patient reports a miscarriage at age 17.  Patient was contemplating adoption prior to patient's parent's finding out about pregnancy. Per patient, patient's parents has agreed to be financial and emotional supportive of patient and baby.  Patient is feeling more excited about being a parent since confirming the support of patient's parents  Social Support (FOB? Who is/will be helping with baby/other kids?): FOB is not involved.  Patient processed with CSW patient's thoughts and feelings regarding involving FOB.  Per patient report FOB is not aware that patient is pregnant.  Patient will consult with patient's parent's before making a final decision about involving FOB.   Couples Relationship (describe): Per patient report, there is no relationship.   Recent Stressful Life Events (life changes in past year?):  None reported   Prenatal Care/Education/Home Preparations: None reported, however per patient's report, patient's parents will assist patient with obtaining a car seat and safe sleeping space for baby.    Domestic Violence (of any type):  No If Yes to Domestic Violence, Describe/Action Plan:     Substance Use During Pregnancy: Yes (patient had positive UDS on 10/22/15 for TCH and Cocaine) (If Yes, Complete SBIRT)  Complete PHQ-9 (Depresssion Screening) on all Antenatal Patients PHQ-9 Score (If Score => 15 complete TREAT):    Follow-up Recommendations:  CSW will follow-up with patient after L&D.     Patient Advised/Response:   CSW informed patient of hospital policy and procedure regarding substance use with moms during pregnancy.  Patient asked appropriate questions and expressed feelings of being afraid.  CSW assisted patient with processing patient's thoughts and feelings and patient reported feeling "better". Patient is understanding of hospital policy and procedures.    Other:  None Reported   Clinical Assessment/Plan:   Per patient's report, patient received pregnancy confirmation around 4 months ago.  Patient did not seek prenatal care and does not currently have insurance.  Patient is not involved with FOB and FOB is not aware of pregnancy.  Patient contemplated about adoptions prior to hospital admission, however since confirming pregnancy with patient's parents, patient will not be moving forward with adoption.  Patient's parents has agreed to support patient and baby financial and emotionally. Patient is aware of hospital drug screen policy and procedure and was made aware   of the need to have a car seat and safe sleeping  space prior to infant's dc.   CSW will follow-up with patient after L&D.  Arlind Klingerman Boyd-Gilyard, MSW, LCSW Clinical Social Work [phone] 

## 2016-03-24 NOTE — Progress Notes (Signed)
Pt awakened by pain in her abdomen. Pt up to void and RN palpating 2 contractions while patient in the bathroom. Reviewed with MD that patient was noted to be cephalic on last nights u/s. MD will come and assess when she gets out of OR.

## 2016-03-24 NOTE — Progress Notes (Signed)
Marker given to patient to mark the times that she feels the pain.

## 2016-03-24 NOTE — Progress Notes (Signed)
ACULTY PRACTICE ANTEPARTUM COMPREHENSIVE PROGRESS NOTE  Mary Herrera is a 20 y.o. G2P0010 at [redacted]w[redacted]d  who is admitted for PROM, no prenatal care and + UDS. Fetal presentation is unsure. Length of Stay:  1  Days  Subjective: Pt has no complaints this morning. She denies any ut ctx or Vb. Still LOF at times. + FM   Vitals:  Blood pressure (!) 106/49, pulse 77, temperature 98 F (36.7 C), temperature source Oral, resp. rate 18, height 5\' 3"  (1.6 m), weight 178 lb (80.7 kg), last menstrual period 08/24/2015, SpO2 99 %. Physical Examination: General appearance - alert, well appearing, and in no distress Cervical Exam: Not evaluated Extremities: Homans sign is negative, no sign of DVT with DTRs 2+ bilaterally Membranes:intact, ruptured  Fetal Monitoring:  Baseline: 130's bpm  Labs:  Results for orders placed or performed during the hospital encounter of 03/23/16 (from the past 24 hour(s))  Urinalysis, Routine w reflex microscopic (not at [facility])   Collection Time: 03/23/16  3:47 PM  Result Value Ref Range   Color, Urine AMBER (A) YELLOW   APPearance CLEAR CLEAR   Specific Gravity, Urine 1.020 1.005 - 1.030   pH 6.0 5.0 - 8.0   Glucose, UA 100 (A) NEGATIVE mg/dL   Hgb urine dipstick LARGE (A) NEGATIVE   Bilirubin Urine MODERATE (A) NEGATIVE   Ketones, ur >80 (A) NEGATIVE mg/dL   Protein, ur 30 (A) NEGATIVE mg/dL   Nitrite NEGATIVE NEGATIVE   Leukocytes, UA LARGE (A) NEGATIVE  Urine microscopic-add on   Collection Time: 03/23/16  3:47 PM  Result Value Ref Range   Squamous Epithelial / LPF 6-30 (A) NONE SEEN   WBC, UA 6-30 0 - 5 WBC/hpf   RBC / HPF 0-5 0 - 5 RBC/hpf   Bacteria, UA FEW (A) NONE SEEN   Urine-Other MUCOUS PRESENT   Urine rapid drug screen (hosp performed)   Collection Time: 03/23/16  3:47 PM  Result Value Ref Range   Opiates NONE DETECTED NONE DETECTED   Cocaine POSITIVE (A) NONE DETECTED   Benzodiazepines NONE DETECTED NONE DETECTED   Amphetamines NONE  DETECTED NONE DETECTED   Tetrahydrocannabinol POSITIVE (A) NONE DETECTED   Barbiturates NONE DETECTED NONE DETECTED  Wet prep, genital   Collection Time: 03/23/16  5:00 PM  Result Value Ref Range   Yeast Wet Prep HPF POC NONE SEEN NONE SEEN   Trich, Wet Prep NONE SEEN NONE SEEN   Clue Cells Wet Prep HPF POC PRESENT (A) NONE SEEN   WBC, Wet Prep HPF POC MANY (A) NONE SEEN   Sperm NONE SEEN   Hepatitis B surface antigen   Collection Time: 03/23/16  5:31 PM  Result Value Ref Range   Hepatitis B Surface Ag Negative Negative  CBC   Collection Time: 03/23/16  5:31 PM  Result Value Ref Range   WBC 17.4 (H) 4.0 - 10.5 K/uL   RBC 4.22 3.87 - 5.11 MIL/uL   Hemoglobin 13.8 12.0 - 15.0 g/dL   HCT 16.1 09.6 - 04.5 %   MCV 88.9 78.0 - 100.0 fL   MCH 32.7 26.0 - 34.0 pg   MCHC 36.8 (H) 30.0 - 36.0 g/dL   RDW 40.9 81.1 - 91.4 %   Platelets 227 150 - 400 K/uL  Differential   Collection Time: 03/23/16  5:31 PM  Result Value Ref Range   Neutrophils Relative % 85 %   Neutro Abs 14.8 (H) 1.7 - 7.7 K/uL   Lymphocytes Relative 11 %  Lymphs Abs 1.9 0.7 - 4.0 K/uL   Monocytes Relative 4 %   Monocytes Absolute 0.7 0.1 - 1.0 K/uL   Eosinophils Relative 0 %   Eosinophils Absolute 0.0 0.0 - 0.7 K/uL   Basophils Relative 0 %   Basophils Absolute 0.0 0.0 - 0.1 K/uL  Type and screen   Collection Time: 03/23/16  5:31 PM  Result Value Ref Range   ABO/RH(D) O POS    Antibody Screen NEG    Sample Expiration 03/26/2016     Imaging Studies:    U/S report pending   Medications:  Scheduled . azithromycin  500 mg Intravenous Q24H   Followed by  . [START ON 03/25/2016] azithromycin  500 mg Oral QPC supper  . betamethasone acetate-betamethasone sodium phosphate  12 mg Intramuscular Q24H  . clindamycin (CLEOCIN) IV  900 mg Intravenous Q8H  . docusate sodium  100 mg Oral Daily  . metroNIDAZOLE  500 mg Oral Q12H  . prenatal multivitamin  1 tablet Oral Q1200   I have reviewed the patient's current  medications.  ASSESSMENT: Patient Active Problem List   Diagnosis Date Noted  . Preterm premature rupture of membranes (PPROM) with unknown onset of labor 03/23/2016  . Penicillin allergy 03/23/2016  . Preterm labor in third trimester without delivery 03/23/2016  . Substance abuse affecting pregnancy in third trimester, antepartum 03/23/2016  . Pregnancy with adoption planned, currently in third trimester 03/23/2016  . BV (bacterial vaginosis) 03/23/2016    PLAN: IUP 30 3/7 weeks PROM with advance cervical dilatation. No prenatal care + drug abuse, cocaine  Pt is currently on Abx, s/p BMZ x1 and magnesium. Will continue with this current management for now. Awaiting official U/S report to confirm gestational age. Social worker and NICU consult. Currently no S/Sx of infection or PTL    Hermina StaggersMichael L Corazon Nickolas 03/24/2016,6:19 AM

## 2016-03-24 NOTE — Progress Notes (Signed)
Pt seen on Antepartum, has been feeling contractions for several hours how. Happening about every 5-10 minutes. Cervical check was 9, 0 80%. FHTs 140's mod var AGA. Will transfer to L and d for expectant delivery. Pt has received one dose of betamethasone.

## 2016-03-24 NOTE — Progress Notes (Signed)
Pt transported to L&D 164 via stretcher

## 2016-03-25 MED ORDER — MEDROXYPROGESTERONE ACETATE 150 MG/ML IM SUSP
150.0000 mg | Freq: Once | INTRAMUSCULAR | Status: AC
Start: 2016-03-25 — End: 2016-03-25
  Administered 2016-03-25: 150 mg via INTRAMUSCULAR
  Filled 2016-03-25: qty 1

## 2016-03-25 NOTE — Progress Notes (Signed)
Post Partum Day 1 Subjective: no complaints, voiding, tolerating PO and + flatus  Pt reports that her last Cocaine use was Wednesday, so she may not be able to breast feed yet. She's pumping.  Birth control: Depo in Hospital? Then Nexplanon. [facility], I have concern over reliability. Pt knows she would forget OCP's.   Objective: Blood pressure (!) 108/59, pulse 70, temperature 98.8 F (37.1 C), temperature source Oral, resp. rate 18, height 5\' 3"  (1.6 m), weight 178 lb (80.7 kg), last menstrual period 08/24/2015, SpO2 100 %, unknown if currently breastfeeding.  Physical Exam:  General: alert, cooperative and no distress Lochia: appropriate Uterine Fundus: firm Incision:  DVT Evaluation: No evidence of DVT seen on physical exam.   Recent Labs  03/23/16 1731 03/24/16 0700  HGB 13.8 13.3  HCT 37.5 37.0    Assessment/Plan: Social Work consult and Contraception depo in hospital, then Nexplanon G2P0111, no PNC, PRETERM delivery after cocaine use.  d/c tomorrow  LOS: 2 days   Mary Herrera V 03/25/2016, 9:01 AM

## 2016-03-25 NOTE — Lactation Note (Signed)
This note was copied from a baby's chart. Lactation Consultation Note  Patient Name: Mary Herrera WUJWJ'XToday's Date: 03/25/2016 Reason for consult: Initial assessment;NICU baby;Infant < 6lbs;Late preterm infant   Initial consult with first time mom of 6126 hour old NICU mom. Infant born weighting 5 lb 2.9 oz. Mom with history of + cocaine and THC on 8/31.  Mom reports infant is doing well and has taken a bottle. She reports she plans to BF. Discussed with mom the dangers of infant receiving breastmilk with Cocaine and that if she is doing Cocaine after she returns home, infant should not receive breastmilk. Mom voiced understanding.   Hand Expression Handout and Providing Milk for Your Baby in NICU Booklet given. Discussed supply and demand and milk coming to volume, pumping schedule, what to expect with pumping and breast milk storage. Mom reports she did get some EBM yesterday, she reports she last pumped this morning and did not get any milk today. Mom reports she was shown how to hand express, I showed her again, we did receive gtts of colostrum. Enc mom to pump and follow with hand expression. Mom is noted to have slightly asymetrical breasts L is larger that the R, she reported + breast growth and areolar darkening with pregnancy. Her breasts are wide spaced and somewhat come shaped.   Enc mom to pump every 2-3 hours for 15 minutes on Initiate setting with DEBP followed by hand expression. Mom reports she has been visiting infant and has not had time to pump. Discussed supply and demand and importance of pumping regularly. [facility] Discussed 2 week breast pump rental at d/c, mom said she would be interested.   BF Resources Handout and LC Brochure given, mom informed of BF Support Groups, LC phone # and BF Support Groups. Enc mom to call with questions/concerns. Mom left to go to NICU to visit with family and did not pump at this time.    Maternal Data Formula  Feeding for Exclusion: Yes Reason for exclusion: Mother's choice to formula and breast feed on admission Has patient been taught Hand Expression?: Yes Does the patient have breastfeeding experience prior to this delivery?: No  Feeding Feeding Type: Formula Nipple Type: Slow - flow Length of feed: 45 min (15" PO / 30" NG)  LATCH Score/Interventions                      Lactation Tools Discussed/Used WIC Program: No (Plans to apply) Pump Review: Setup, frequency, and cleaning;Milk Storage Initiated by:: Reviewed   Consult Status Consult Status: Follow-up Date: 03/26/16 Follow-up type: In-patient    Mary Herrera Ask 03/25/2016, 5:42 PM

## 2016-03-25 NOTE — Progress Notes (Signed)
LCSW aware of consult and MOB was seen on 9/1 prior to delivery.  LCSW will be making CPS report today due to current substance use by MOB: +cocaine and THC.  LCSW will follow up with MOB and assist with psycho-social needs and disposition.  Natha Guin LCSW, MSW Clinical Social Work: System Wide Float Coverage for [facility] NICU Clinical social worker [phone] 

## 2016-03-25 NOTE — Discharge Instructions (Signed)
Depo Estradiol injection What is this medicine? ESTRADIOL (es tra DYE ole) is an estrogen. It is used to treat the symptoms of low hormone levels in menopausal women. It is used to treat women who have had their ovaries removed or who have ovaries that do not work well. It helps to treat hot flashes and vaginal problems. It is also used to treat men with some kinds of prostate cancer. This medicine may be used for other purposes; ask your health care provider or pharmacist if you have questions. What should I tell my health care provider before I take this medicine? They need to know if you have or ever had any of these conditions: -abnormal vaginal bleeding -blood vessel disease or blood clots -cancer -gallbladder disease -heart disease or recent heart attack -high blood pressure -high level of calcium in the blood -hysterectomy -protein C deficiency -protein S deficiency -an unusual or allergic reaction to estrogens, other hormones, medicines, foods, dyes, or preservatives -pregnant or trying to get pregnant -breast-feeding How should I use this medicine? This medicine is for injection into a muscle. It is usually given by a health care professional in a hospital or clinic setting. A patient package insert for the product will be given with each prescription and refill. Read this sheet carefully each time. The sheet may change frequently. Talk to your pediatrician regarding the use of this medicine in children. Special care may be needed. Overdosage: If you think you have taken too much of this medicine contact a poison control center or emergency room at once. NOTE: This medicine is only for you. Do not share this medicine with others. What if I miss a dose? It is important not to miss your dose. Call your doctor or health care professional if you are unable to keep an appointment. What may interact with this medicine? Do not take this medicine with any of the following  medications: -aromatase inhibitors like aminoglutethimide, anastrozole, exemestane, letrozole, testolactone This medicine may also interact with the following medications: -carbamazepine -certain antibiotics used to treat infections -certain barbiturates or benzodiazepines used for inducing sleep or treating seizures -grapefruit juice -medicines for fungus infections like itraconazole and ketoconazole -raloxifene or tamoxifen -rifabutin, rifampin, or rifapentine -ritonavir -St. John's Wort -warfarin This list may not describe all possible interactions. Give your health care provider a list of all the medicines, herbs, non-prescription drugs, or dietary supplements you use. Also tell them if you smoke, drink alcohol, or use illegal drugs. Some items may interact with your medicine. What should I watch for while using this medicine? Visit your doctor or health care professional for regular checks on your progress. You will need a regular breast and pelvic exam and Pap smear while on this medicine. You should also discuss the need for regular mammograms with your health care professional, and follow his or her guidelines for these tests. This medicine can make your body retain fluid, making your fingers, hands, or ankles swell. Your blood pressure can go up. Contact your doctor or health care professional if you feel you are retaining fluid. Women should inform their doctor if they wish to become pregnant or think they might be pregnant. There is a potential for serious side effects to an unborn child. Talk to your health care professional or pharmacist for more information. Smoking increases the risk of getting a blood clot or having a stroke while you are taking this medicine, especially if you are more than 20 years old. You are strongly advised  not to smoke. If you wear contact lenses and notice visual changes, or if the lenses begin to feel uncomfortable, consult your eye doctor or health care  professional. This medicine can increase the risk of developing a condition (endometrial hyperplasia) that may lead to cancer of the lining of the uterus. Taking progestins, another hormone drug, with this medicine lowers the risk of developing this condition. Therefore, if your uterus has not been removed (by a hysterectomy), your doctor may prescribe a progestin for you to take together with your estrogen. You should know, however, that taking estrogens with progestins may have additional health risks. You should discuss the use of estrogens and progestins with your health care professional to determine the benefits and risks for you. If you are going to have surgery, let your doctor know you are receiving estrogen. Consult your health care professional for advice before you schedule the surgery. What side effects may I notice from receiving this medicine? Side effects that you should report to your doctor or health care professional as soon as possible: -allergic reactions like skin rash, itching or hives, swelling of the face, lips, or tongue -breast tissue changes or discharge -changes in vision -chest pain -confusion, trouble speaking or understanding -dark urine -general ill feeling or flu-like symptoms -light-colored stools -nausea, vomiting -pain, swelling, warmth in the leg -right upper belly pain -severe headaches -shortness of breath -sudden numbness or weakness of the face, arm or leg -trouble walking, dizziness, loss of balance or coordination -unusual vaginal bleeding -yellowing of the eyes or skin Side effects that usually do not require medical attention (report to your doctor or health care professional if they continue or are bothersome): -hair loss -increased hunger or thirst -increased urination -symptoms of vaginal infection like itching, irritation or unusual discharge -unusually weak or tired This list may not describe all possible side effects. Call your doctor  for medical advice about side effects. You may report side effects to FDA at 1-800-FDA-1088. Where should I keep my medicine? This drug is given in a hospital or clinic and will not be stored at home. NOTE: This sheet is a summary. It may not cover all possible information. If you have questions about this medicine, talk to your doctor, pharmacist, or health care provider.    2016, Elsevier/Gold Standard. (2012-10-30 13:53:26)

## 2016-03-26 LAB — RAPID URINE DRUG SCREEN, HOSP PERFORMED
AMPHETAMINES: NOT DETECTED
BARBITURATES: NOT DETECTED
Benzodiazepines: NOT DETECTED
Cocaine: NOT DETECTED
OPIATES: NOT DETECTED
TETRAHYDROCANNABINOL: POSITIVE — AB

## 2016-03-26 MED ORDER — PNEUMOCOCCAL VAC POLYVALENT 25 MCG/0.5ML IJ INJ
0.5000 mL | INJECTION | Freq: Once | INTRAMUSCULAR | Status: AC
  Administered 2016-03-26: 0.5 mL via INTRAMUSCULAR
  Filled 2016-03-26: qty 0.5

## 2016-03-26 MED ORDER — IBUPROFEN 600 MG PO TABS: 600.0000 mg | ORAL_TABLET | Freq: Four times a day (QID) | ORAL | 0 refills | Status: AC

## 2016-03-26 NOTE — Lactation Note (Signed)
This note was copied from a baby's chart. Lactation Consultation Note  Patient Name: Mary Sallye OberBrenda Vazquez-Garcia RUEAV'WToday's Date: 03/26/2016   Mom is pumping EBM for her infant. Spoke with Marisue IvanLiz, RN in NICU and Timearmen NNP in NICU in regards to wether they will be giving EBM to infant. Mom has had + UDS on 3/31 and 8/31 (on admission) for Cocaine and THC. Porfirio MylarCarmen reported that if a UDS is collected today and it is negative that the infant can receive EBM that is collected today. Selena BattenKim, RN on 3rd floor is to call OB to request UDS. NNP or Neonatologist are planning to speak with mother sometime today. Mom was told yesterday that cocaine given to the baby via milk can be detrimental and could injure or kill and infant who receives it by this LC. She was asked not to bring in breast milk to infant if she is using drugs.      Maternal Data    Feeding Feeding Type: Formula Nipple Type: Slow - flow Length of feed: 20 min  LATCH Score/Interventions                      Lactation Tools Discussed/Used     Consult Status      Mary Herrera 03/26/2016, 10:59 AM

## 2016-03-26 NOTE — Discharge Summary (Signed)
OB Discharge Summary  Patient Name: Mary Herrera DOB: Nov 19, 1995 MRN: [id number]  Date of admission: 03/23/2016 Delivering MD: Lorne Skeens   Date of discharge: 03/26/2016  Admitting diagnosis: preg. a lot of pain, and bleeding, cramping Intrauterine pregnancy: [redacted]w[redacted]d     Secondary diagnosis:Active Problems:   Preterm premature rupture of membranes (PPROM) with unknown onset of labor   Penicillin allergy   Preterm labor in third trimester without delivery   Substance abuse affecting pregnancy in third trimester, antepartum   Pregnancy with adoption planned, currently in third trimester   BV (bacterial vaginosis)   Preterm labor  Additional problems:none     Discharge diagnosis: Preterm Pregnancy Delivered                                                                     Post partum procedures:n/a  Augmentation: n/a  Complications: None  Hospital course:  Onset of Labor With Vaginal Delivery     20 y.o. yo G2P0111 at [redacted]w[redacted]d was admitted in Active Labor on 03/23/2016. Patient had an uncomplicated labor course as follows:  Membrane Rupture Time/Date: 4:00 PM ,03/23/2016   Intrapartum Procedures: Episiotomy: None [1]                                         Lacerations:  1st degree [2];Vaginal [6]  Patient had a delivery of a Viable infant. 03/24/2016  Information for the patient's newborn:  Mary Herrera, Mary Herrera Mary Herrera [[id number]]       Pateint had an uncomplicated postpartum course.  She is ambulating, tolerating a regular diet, passing flatus, and urinating well. Patient is discharged home in stable condition on 03/26/16.    Physical exam Vitals:   03/25/16 1249 03/25/16 1817 03/25/16 2129 03/26/16 0536  BP: 125/70 120/69 119/73 (!) 103/53  Pulse: 67 64 60 60  Resp: 18 18 18 14   Temp: 98.3 F (36.8 C) 98.3 F (36.8 C) 98.1 F (36.7 C) 98.1 F (36.7 C)  TempSrc: Oral Oral Oral Oral  SpO2: 99% 100% 100% 100%  Weight:      Height:        General: alert, cooperative and no distress Lochia: appropriate Uterine Fundus: firm Incision: N/A DVT Evaluation: No evidence of DVT seen on physical exam. Negative Homan's sign. Labs: Lab Results  Component Value Date   WBC 19.5 (H) 03/24/2016   HGB 13.3 03/24/2016   HCT 37.0 03/24/2016   MCV 91.1 03/24/2016   PLT 227 03/24/2016   CMP Latest Ref Rng & Units 07/24/2015  Glucose 65 - 99 mg/dL 782(N)  BUN 6 - 20 mg/dL 20  Creatinine 5.62 - 1.30 mg/dL 8.65  Sodium 784 - 696 mmol/L 142  Potassium 3.5 - 5.1 mmol/L 3.7  Chloride 101 - 111 mmol/L 106  CO2 22 - 32 mmol/L -  Calcium 8.9 - 10.3 mg/dL -    Discharge instruction: per After Visit Summary and "Baby and Me Booklet".  After Visit Meds:    Medication List    TAKE these medications   ibuprofen 600 MG tablet Commonly known as:  ADVIL,MOTRIN Take 1 tablet (600 mg total) by mouth  every 6 (six) hours.       Diet: routine diet  Activity: Advance as tolerated. Pelvic rest for 6 weeks.   Outpatient follow up:6 weeks Follow up Appt:No future appointments. Follow up visit: No Follow-up on file.  Postpartum contraception: Depo Provera  Newborn Data: Live born female  Birth Weight: 5 lb 2.9 oz (2350 g) APGAR: 8, 9  Baby Feeding: Bottle Disposition:NICU   03/26/2016 Mary DuskyMarie Eldean Herrera, CNM

## 2016-03-26 NOTE — Progress Notes (Signed)

## 2016-03-26 NOTE — Lactation Note (Signed)
This note was copied from a baby's chart. Lactation Consultation Note  Patient Name: Girl Sallye OberBrenda Vazquez-Garcia WGNFA'OToday's Date: 03/26/2016 Reason for consult: Follow-up assessment;Pump rental   Spoke with mother and she requested a 2 week pump rental. Pump rental completed. Mom reports sheis pumping and her milk is starting to come in. Enc her to maintain pumping every 2-3 hours for 15 minutes. Mom has bottles and labels to take home. Enc mom to call with questions/concerns prn. Mom is aware LC is available for support while infant in NICU.    Maternal Data Reason for exclusion: Mother's choice to formula and breast feed on admission  Feeding Feeding Type: Formula Nipple Type: Slow - flow Length of feed: 30 min  LATCH Score/Interventions                      Lactation Tools Discussed/Used WIC Program: No Pump Review: Setup, frequency, and cleaning;Milk Storage   Consult Status Consult Status: PRN Follow-up type: Call as needed    Ed BlalockSharon S Sagan Wurzel 03/26/2016, 1:45 PM

## 2016-03-26 NOTE — Progress Notes (Signed)
Discharge instructions provided to patient at bedside.  Activity, medications, follow up appointments, when to call the doctor and community resources discussed.  No questions at this time.  Patient left unit in stable condition with all personal belongings and breast pump rental accompanied by staff.  Osvaldo AngstK. Kollins Fenter, RN------------------------

## 2016-04-11 ENCOUNTER — Ambulatory Visit: Payer: Self-pay

## 2016-04-11 NOTE — Lactation Note (Signed)
This note was copied from a baby's chart. Lactation Consultation Note  Patient Name: Mary Herrera EAVWU'JToday's Date: 04/11/2016 Reason for consult: Follow-up assessment;NICU baby  NICU baby 52 weeks old. Gma feeding baby a bottle when this LC entered the room. Mom reports that she put the baby to breast through the night and the baby seemed to nurse well. Discussed with mom the need to continue to offer supplemental EBM after baby at breast. Mom aware of OP/BFSG and LC phone line assistance after D/C. Mom reports that she is going to visit WIC with baby and see if they can help with BF as well. Enc mom to keep pumping after each feeding to protect her milk supply.  Maternal Data    Feeding Feeding Type: Breast Milk  LATCH Score/Interventions                      Lactation Tools Discussed/Used     Consult Status Consult Status: PRN    Sherlyn HayJennifer D Rainbow Salman 04/11/2016, 12:29 PM

## 2016-05-08 ENCOUNTER — Ambulatory Visit (INDEPENDENT_AMBULATORY_CARE_PROVIDER_SITE_OTHER): Payer: Self-pay | Admitting: Family Medicine

## 2016-05-08 ENCOUNTER — Encounter: Payer: Self-pay | Admitting: Family Medicine

## 2016-05-08 DIAGNOSIS — Z3009 Encounter for other general counseling and advice on contraception: Secondary | ICD-10-CM

## 2016-05-08 NOTE — Patient Instructions (Signed)

## 2016-05-08 NOTE — Progress Notes (Signed)
Subjective:     Mary Herrera is a 20 y.o. female who presents for a postpartum visit. She is 6 weeks postpartum following a spontaneous vaginal delivery. I have fully reviewed the prenatal and intrapartum course. The delivery was at 30 gestational weeks. Outcome: spontaneous vaginal delivery. Anesthesia: local. Postpartum course has been uncomplicated. Baby's course has been doing well since 19 day stay in NICU. Baby is feeding by both breast and bottle - Similac Neosure supplemental formula, but mostly breast milk or pumped milk. Bleeding brown. Bowel function is abnormal: very hard stools, hurts. . Bladder function is normal. Patient is not sexually active. Contraception method is Depo-Provera injections. Postpartum depression screening: negative.  Asked patient about prenatal course and current situation. Patient states she has good family support, helping with taking care of baby. She denies any sadness, crying, suicidal ideations, or thoughts of harming self/baby. She decided to keep the baby. Refusing so meet with office BHT, Asher MuirJamie.  Discussed future pregnancies and importance of prenatal care.   The following portions of the patient's history were reviewed and updated as appropriate: allergies, current medications, past family history, past medical history, past social history, past surgical history and problem list.  Review of Systems Pertinent items noted in HPI and remainder of comprehensive ROS otherwise negative.   Objective:    BP 131/68   Pulse 89   Wt 163 lb 11.2 oz (74.3 kg)   Breastfeeding? Yes   BMI 29.00 kg/m   General:  alert, cooperative, appears stated age and no distress   Breasts:  inspection negative, no nipple discharge or bleeding, no masses or nodularity palpable  Lungs: clear to auscultation bilaterally  Heart:  regular rate and rhythm, S1, S2 normal, no murmur, click, rub or gallop  Abdomen: soft, non-tender; bowel sounds normal; no masses,  no  organomegaly  Extremities: No edema  Neuro: CN 2-12 grossly intact        Assessment:   6 week postpartum exam. Pap smear next year when 20 y/o    Plan:    1. Contraception: Depo-Provera injections, will get future injections at health dept, given injection before discharge at hospital. 2. Reviewed signs/symptoms of depression and discussed BHT at her disposal in office if she feels necessary. 3. Follow up in: 3 months for Depo injection, 1 year for pap, or as needed.

## 2016-05-11 ENCOUNTER — Encounter: Payer: Self-pay | Admitting: Family Medicine
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
## 2021-11-05 ENCOUNTER — Emergency Department (HOSPITAL_COMMUNITY)
Admission: EM | Admit: 2021-11-05 | Discharge: 2021-11-05 | Disposition: A | Discharge status: Home / Self Care | Payer: Self-pay | Attending: Emergency Medicine | Admitting: Emergency Medicine

## 2021-11-05 ENCOUNTER — Encounter (HOSPITAL_COMMUNITY): Payer: Self-pay | Admitting: Emergency Medicine

## 2021-11-05 ENCOUNTER — Other Ambulatory Visit: Payer: Self-pay

## 2021-11-05 DIAGNOSIS — L292 Pruritus vulvae: Secondary | ICD-10-CM | POA: Insufficient documentation

## 2021-11-05 DIAGNOSIS — N898 Other specified noninflammatory disorders of vagina: Secondary | ICD-10-CM

## 2021-11-05 NOTE — ED Notes (Signed)
Discharged by PA at triage. 

## 2021-11-05 NOTE — ED Triage Notes (Signed)
Pt states she believes she has a yeast infection. Reports swollen vagina and white vaginal discharge x 1 week. ?

## 2021-11-05 NOTE — Discharge Instructions (Addendum)
The address for the health department is: [address] [place], [place] [address] ? ?Urgent care is another good option for any concerns for STDs/yeast infections.  Return with any severe abdominal pain, fevers, chills or uncontrolled vaginal bleeding. ?

## 2021-11-05 NOTE — ED Provider Notes (Signed)
?[facility] EMERGENCY DEPARTMENT ?Provider Note ? ? ?CSN: [id number] ?Arrival date & time: 11/05/21  1115 ? ?  ? ?History ?No chief complaint on file. ? ? ?Mary Herrera is a 26 y.o. female presenting today due to vaginal itching and discomfort.  She says this has been going on for a week however in the last 2 days she has started to note white discharge.  Says she tried to treat herself with over-the-counter Monistat however this did not help her.  Is sexually active, does not believe she has any STDs but says it is possible.  Last menstrual period started on 4/3.  No urinary symptoms.  No abdominal pain. ? ?HPI ? ?  ? ?Home Medications ?Prior to Admission medications   ?Medication Sig Start Date End Date Taking? Authorizing Provider  ?ibuprofen (ADVIL,MOTRIN) 600 MG tablet Take 1 tablet (600 mg total) by mouth every 6 (six) hours. 03/26/16   Montez Morita, CNM  ?   ? ?Allergies    ?Penicillins   ? ?Review of Systems   ?Review of Systems ? ?Physical Exam ?Updated Vital Signs ?BP 133/89   Pulse 88   Temp 97.9 ?F (36.6 ?C)   Resp 18   LMP 10/24/2021   SpO2 99%  ?Physical Exam ?Vitals and nursing note reviewed.  ?Constitutional:   ?   Appearance: Normal appearance.  ?HENT:  ?   Head: Normocephalic and atraumatic.  ?Eyes:  ?   General: No scleral icterus. ?   Conjunctiva/sclera: Conjunctivae normal.  ?Pulmonary:  ?   Effort: Pulmonary effort is normal. No respiratory distress.  ?Abdominal:  ?   General: Abdomen is flat.  ?   Palpations: Abdomen is soft.  ?   Tenderness: There is no abdominal tenderness.  ?Genitourinary: ?   Comments: GU exam deferred in triage ?Skin: ?   Findings: No rash.  ?Neurological:  ?   Mental Status: She is alert.  ?Psychiatric:     ?   Mood and Affect: Mood normal.  ? ? ?ED Results / Procedures / Treatments   ?Labs ?(all labs ordered are listed, but only abnormal results are displayed) ?Labs Reviewed - No data to display ? ?EKG ?None ? ?Radiology ?No results  found. ? ?Procedures ?Procedures  ? ?Medications Ordered in ED ?Medications - No data to display ? ?ED Course/ Medical Decision Making/ A&P ?  ?                        ?Medical Decision Making ? ?26 year old female who presented with concern for yeast infection.  York Spaniel that it is possible she could have any STDs however she is not sure of anything she has been exposed of.  No urinary symptoms. ? ?Physical exam: Patient well-appearing, vital signs stable. ? ?MDM/disposition: I informed the patient that I am unable to do a vaginal exam or STD testing from triage.  I offered to discharge her home with resources for the health department for further testing however she was offered a full evaluation in the back.  She says she would prefer not to wait and will follow-up outpatient.  Stable in appearance, vital signs, no abdominal tenderness, low suspicion for PID.  Discharged home in stable condition for health department follow-up on Monday ? ?Final Clinical Impression(s) / ED Diagnoses ?Final diagnoses:  ?Vaginal itching  ? ? ?Rx / DC Orders ? ? ?Results and diagnoses were explained to the patient. Return precautions discussed in full. Patient  had no additional questions and expressed complete understanding. ? ? ?This chart was dictated using voice recognition software.  Despite best efforts to proofread,  errors can occur which can change the documentation meaning.  ? ?  ?Saddie Benders, PA-C ?11/05/21 1547 ? ?  ?Margarita Grizzle, MD ?11/07/21 1408 ? ?

## 2021-12-10 ENCOUNTER — Ambulatory Visit (HOSPITAL_COMMUNITY)
Admission: EM | Admit: 2021-12-10 | Discharge: 2021-12-10 | Disposition: A | Discharge status: Home / Self Care | Payer: Self-pay | Attending: Emergency Medicine | Admitting: Emergency Medicine

## 2021-12-10 ENCOUNTER — Encounter (HOSPITAL_COMMUNITY): Payer: Self-pay | Admitting: Emergency Medicine

## 2021-12-10 DIAGNOSIS — N898 Other specified noninflammatory disorders of vagina: Secondary | ICD-10-CM | POA: Insufficient documentation

## 2021-12-10 LAB — POC URINE PREG, ED: Preg Test, Ur: NEGATIVE

## 2021-12-10 LAB — POCT URINALYSIS DIPSTICK, ED / UC
Bilirubin Urine: NEGATIVE
Glucose, UA: NEGATIVE mg/dL
Ketones, ur: NEGATIVE mg/dL
Nitrite: NEGATIVE
Protein, ur: NEGATIVE mg/dL
Specific Gravity, Urine: 1.02 (ref 1.005–1.030)
Urobilinogen, UA: 0.2 mg/dL (ref 0.0–1.0)
pH: 6 (ref 5.0–8.0)

## 2021-12-10 NOTE — ED Provider Notes (Signed)
[facility]    CSN: [id number] Arrival date & time: 12/10/21  1029      History   Chief Complaint Chief Complaint  Patient presents with   Vaginal Discharge   Vaginal Itching    HPI Mary Herrera is a 26 y.o. female.  She presents with 1 month history of vaginal discharge and irritation.  States she had some labial swelling and pain that has resolved.  Originally she thought she had a yeast infection and took medication for this that did not help.  She is sexually active, no known STD exposure.  She has never had a Pap smear or pelvic exam. Denies urinary symptoms, fever, chills, back pain, abdominal pain, vomiting/diarrhea.  Past Medical History:  Diagnosis Date   Headache    Medical history non-contributory     Patient Active Problem List   Diagnosis Date Noted   Preterm labor 03/24/2016   Preterm premature rupture of membranes (PPROM) with unknown onset of labor 03/23/2016   Penicillin allergy 03/23/2016   Preterm labor in third trimester without delivery 03/23/2016   Substance abuse affecting pregnancy in third trimester, antepartum 03/23/2016   Pregnancy with adoption planned, currently in third trimester 03/23/2016   BV (bacterial vaginosis) 03/23/2016    Past Surgical History:  Procedure Laterality Date   NO PAST SURGERIES      OB History     Gravida  2   Para  1   Term      Preterm  1   AB  1   Living  1      SAB  1   IAB      Ectopic      Multiple  0   Live Births  1            Home Medications    Prior to Admission medications   Medication Sig Start Date End Date Taking? Authorizing Provider  ibuprofen (ADVIL,MOTRIN) 600 MG tablet Take 1 tablet (600 mg total) by mouth every 6 (six) hours. 03/26/16   Keitha Butte, CNM    Family History Family History  Problem Relation Age of Onset   Diabetes Maternal Grandmother    Heart disease Maternal Grandmother    Hypertension Maternal Grandmother     Hyperlipidemia Maternal Grandfather    Alcohol abuse Neg Hx    Arthritis Neg Hx    Asthma Neg Hx    Birth defects Neg Hx    Cancer Neg Hx    COPD Neg Hx    Depression Neg Hx    Drug abuse Neg Hx    Early death Neg Hx    Hearing loss Neg Hx    Kidney disease Neg Hx    Learning disabilities Neg Hx    Mental illness Neg Hx    Mental retardation Neg Hx    Miscarriages / Stillbirths Neg Hx    Stroke Neg Hx    Vision loss Neg Hx    Varicose Veins Neg Hx     Social History Social History   Tobacco Use   Smoking status: Former    Packs/day: 0.25    Years: 1.00    Pack years: 0.25    Types: Cigarettes    Quit date: 02/20/2015    Years since quitting: 6.8   Smokeless tobacco: Never  Vaping Use   Vaping Use: Every day  Substance Use Topics   Alcohol use: Yes    Comment: during    Drug use: Yes  Types: Marijuana, Cocaine    Comment: lasst use  January 11 2015     Allergies   Penicillins   Review of Systems Review of Systems  Genitourinary:  Positive for vaginal discharge.   As per HPI  Physical Exam Triage Vital Signs ED Triage Vitals  Enc Vitals Group     BP 12/10/21 1100 137/87     Pulse Rate 12/10/21 1100 75     Resp 12/10/21 1100 18     Temp 12/10/21 1100 98 F (36.7 C)     Temp Source 12/10/21 1100 Oral     SpO2 12/10/21 1100 98 %     Weight --      Height --      Head Circumference --      Peak Flow --      Pain Score 12/10/21 1057 0     Pain Loc --      Pain Edu? --      Excl. in [place]? --    No data found.  Updated Vital Signs BP 137/87   Pulse 75   Temp 98 F (36.7 C) (Oral)   Resp 18   LMP 12/05/2021   SpO2 98%   Breastfeeding No   Visual Acuity Right Eye Distance:   Left Eye Distance:   Bilateral Distance:    Right Eye Near:   Left Eye Near:    Bilateral Near:     Physical Exam Vitals and nursing note reviewed. Exam conducted with a chaperone present.  Constitutional:      General: She is not in acute distress.     Appearance: She is well-developed.  HENT:     Head: Normocephalic and atraumatic.  Eyes:     Conjunctiva/sclera: Conjunctivae normal.  Cardiovascular:     Rate and Rhythm: Normal rate and regular rhythm.     Heart sounds: Normal heart sounds.  Pulmonary:     Effort: Pulmonary effort is normal. No respiratory distress.     Breath sounds: Normal breath sounds.  Abdominal:     Palpations: Abdomen is soft.     Tenderness: There is no abdominal tenderness. There is no right CVA tenderness or left CVA tenderness.  Genitourinary:    Pubic Area: No rash.      Labia:        Right: No rash, tenderness or lesion.        Left: No rash, tenderness or lesion.      Vagina: Vaginal discharge present. No lesions.     Cervix: Discharge present. No cervical motion tenderness.     Comments: Moderate amount of thin white discharge present Skin:    General: Skin is warm and dry.  Neurological:     Mental Status: She is alert.  Psychiatric:        Mood and Affect: Mood normal.     UC Treatments / Results  Labs (all labs ordered are listed, but only abnormal results are displayed) Labs Reviewed  POCT URINALYSIS DIPSTICK, ED / UC - Abnormal; Notable for the following components:      Result Value   Hgb urine dipstick SMALL (*)    Leukocytes,Ua LARGE (*)    All other components within normal limits  URINE CULTURE  POC URINE PREG, ED  CERVICOVAGINAL ANCILLARY ONLY    EKG  Radiology No results found.  Procedures Procedures (including critical care time)  Medications Ordered in UC Medications - No data to display  Initial Impression / Assessment and Plan /  UC Course  I have reviewed the triage vital signs and the nursing notes.  Pertinent labs & imaging results that were available during my care of the patient were reviewed by me and considered in my medical decision making (see chart for details).   Exam conducted with chaperone present.  Patient seems to have moderate to large  amount of discharge, we will wait for cytology results.  No lesions, rash, swelling, abscess noted.  Patient had no urinary symptoms but we conducted urinalysis showing large leuks and small hemoglobin.  Urine pregnancy test negative.  Culture is pending.  Discussed with patient we will wait for cytology and treat anything positive as needed.  She will be establishing with a primary care in the area, recommended to follow-up with them. Return precautions discussed. Patient agrees to plan and is discharged in stable condition.  Final Clinical Impressions(s) / UC Diagnoses   Final diagnoses:  Vaginal discharge     Discharge Instructions      You will receive your results via MyChart.  Please return to the urgent care or emergency department if symptoms worsen or do not improve.    ED Prescriptions   None    PDMP not reviewed this encounter.   Mayo Owczarzak, Vernice Jefferson 12/10/21 1245

## 2021-12-10 NOTE — ED Triage Notes (Signed)
Pt is present today with vaginal irritation, discharge, and labia swelling. Pt sx started one month on and off.

## 2021-12-10 NOTE — Discharge Instructions (Signed)
You will receive your results via MyChart.  Please return to the urgent care or emergency department if symptoms worsen or do not improve.

## 2021-12-11 LAB — URINE CULTURE: Culture: NO GROWTH

## 2021-12-12 LAB — CERVICOVAGINAL ANCILLARY ONLY
Bacterial Vaginitis (gardnerella): POSITIVE — AB
Candida Glabrata: NEGATIVE
Candida Vaginitis: NEGATIVE
Chlamydia: NEGATIVE
Comment: NEGATIVE
Comment: NEGATIVE
Comment: NEGATIVE
Comment: NEGATIVE
Comment: NEGATIVE
Comment: NORMAL
Neisseria Gonorrhea: NEGATIVE
Trichomonas: POSITIVE — AB

## 2021-12-13 ENCOUNTER — Telehealth (HOSPITAL_COMMUNITY): Payer: Self-pay | Admitting: Emergency Medicine

## 2021-12-13 MED ORDER — METRONIDAZOLE 500 MG PO TABS: 500.0000 mg | ORAL_TABLET | Freq: Two times a day (BID) | ORAL | 0 refills | Status: AC
